# Patient Record
Sex: Female | Born: 1980
Health system: Southern US, Community
[De-identification: ages and names within clinical notes are randomized; demographics above are authoritative.]

## PROBLEM LIST (undated history)

## (undated) ENCOUNTER — Inpatient Hospital Stay (HOSPITAL_COMMUNITY): Payer: Self-pay

## (undated) DIAGNOSIS — E669 Obesity, unspecified: Secondary | ICD-10-CM

## (undated) DIAGNOSIS — A159 Respiratory tuberculosis unspecified: Secondary | ICD-10-CM

## (undated) DIAGNOSIS — Z9889 Other specified postprocedural states: Secondary | ICD-10-CM

## (undated) DIAGNOSIS — E162 Hypoglycemia, unspecified: Secondary | ICD-10-CM

## (undated) DIAGNOSIS — R87619 Unspecified abnormal cytological findings in specimens from cervix uteri: Secondary | ICD-10-CM

## (undated) DIAGNOSIS — R0601 Orthopnea: Secondary | ICD-10-CM

## (undated) DIAGNOSIS — G43909 Migraine, unspecified, not intractable, without status migrainosus: Secondary | ICD-10-CM

## (undated) DIAGNOSIS — IMO0002 Reserved for concepts with insufficient information to code with codable children: Secondary | ICD-10-CM

## (undated) DIAGNOSIS — R51 Headache: Secondary | ICD-10-CM

## (undated) DIAGNOSIS — N390 Urinary tract infection, site not specified: Secondary | ICD-10-CM

## (undated) HISTORY — DX: Orthopnea: R06.01

## (undated) HISTORY — PX: ROOT CANAL: SHX2363

## (undated) HISTORY — PX: NO PAST SURGERIES: SHX2092

## (undated) HISTORY — DX: Other specified postprocedural states: Z98.890

## (undated) HISTORY — DX: Obesity, unspecified: E66.9

## (undated) HISTORY — DX: Respiratory tuberculosis unspecified: A15.9

## (undated) HISTORY — DX: Migraine, unspecified, not intractable, without status migrainosus: G43.909

---
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
## 2001-08-05 ENCOUNTER — Emergency Department (HOSPITAL_COMMUNITY): Admission: EM | Admit: 2001-08-05 | Discharge: 2001-08-05 | Payer: Self-pay | Admitting: Emergency Medicine

## 2003-03-18 ENCOUNTER — Other Ambulatory Visit: Admission: RE | Admit: 2003-03-18 | Discharge: 2003-03-18 | Payer: Self-pay | Admitting: Obstetrics and Gynecology

## 2004-10-07 ENCOUNTER — Emergency Department (HOSPITAL_COMMUNITY): Admission: EM | Admit: 2004-10-07 | Discharge: 2004-10-07 | Payer: Self-pay | Admitting: Emergency Medicine

## 2006-10-19 ENCOUNTER — Emergency Department (HOSPITAL_COMMUNITY): Admission: EM | Admit: 2006-10-19 | Discharge: 2006-10-19 | Payer: Self-pay | Admitting: Emergency Medicine

## 2006-12-27 ENCOUNTER — Encounter: Admission: RE | Admit: 2006-12-27 | Discharge: 2006-12-27 | Payer: Self-pay | Admitting: Internal Medicine

## 2011-03-01 LAB — RUBELLA ANTIBODY, IGM: Rubella: IMMUNE

## 2011-03-01 LAB — HIV ANTIBODY (ROUTINE TESTING W REFLEX): HIV: NONREACTIVE

## 2011-03-01 LAB — ABO/RH: RH Type: POSITIVE

## 2011-03-01 LAB — HEPATITIS B SURFACE ANTIGEN: Hepatitis B Surface Ag: NEGATIVE

## 2011-03-07 ENCOUNTER — Encounter (HOSPITAL_COMMUNITY): Payer: Self-pay

## 2011-03-07 ENCOUNTER — Inpatient Hospital Stay (HOSPITAL_COMMUNITY)
Admission: AD | Admit: 2011-03-07 | Discharge: 2011-03-07 | Disposition: A | Discharge status: Home / Self Care | Payer: PRIVATE HEALTH INSURANCE | Source: Ambulatory Visit | Attending: Obstetrics and Gynecology | Admitting: Obstetrics and Gynecology

## 2011-03-07 ENCOUNTER — Inpatient Hospital Stay (HOSPITAL_COMMUNITY): Payer: PRIVATE HEALTH INSURANCE

## 2011-03-07 DIAGNOSIS — O209 Hemorrhage in early pregnancy, unspecified: Secondary | ICD-10-CM

## 2011-03-07 DIAGNOSIS — O200 Threatened abortion: Secondary | ICD-10-CM | POA: Insufficient documentation

## 2011-03-07 HISTORY — DX: Reserved for concepts with insufficient information to code with codable children: IMO0002

## 2011-03-07 HISTORY — DX: Hypoglycemia, unspecified: E16.2

## 2011-03-07 LAB — CBC
Hemoglobin: 13 g/dL (ref 12.0–15.0)
MCHC: 32.3 g/dL (ref 30.0–36.0)
WBC: 12.2 10*3/uL — ABNORMAL HIGH (ref 4.0–10.5)

## 2011-03-07 NOTE — Progress Notes (Signed)
Pt states she started having bleeding 9-2, again during the night and this am started cramping. Last sex was oral yesterday. Has had 2 previous episodes of bleeding after intercourse. Has a documented IUP in the office.

## 2011-03-07 NOTE — ED Provider Notes (Signed)
Subjective: Pt presents with complaint of spotting and cramping for one day.  She passed a clot and then started spotting.  She has not had any pain meds.  NO SPB OR CP  Objective: I have reviewed patient's vital signs, intake and output, medications, labs, microbiology, pathology and radiology results.  General: alert Resp: clear to auscultation bilaterally Cardio: regular rate and rhythm, S1, S2 normal, no murmur, click, rub or gallop GI: soft, non-tender; bowel sounds normal; no masses,  no organomegaly Extremities: extremities normal, atraumatic, no cyanosis or edema Vaginal Bleeding: no blood in vault.  cx LCP Rh positive [place] sig for live IUP at 7 weeks with [facility]   Assessment/Plan: threatned abortion Dc home with bleeding precautions  LOS: 0 days    Laura Calderon A 03/07/2011, 2:25 PM

## 2011-08-02 ENCOUNTER — Inpatient Hospital Stay (HOSPITAL_COMMUNITY)
Admission: AD | Admit: 2011-08-02 | Discharge: 2011-08-02 | Disposition: A | Discharge status: Home / Self Care | Payer: PRIVATE HEALTH INSURANCE | Source: Ambulatory Visit | Attending: Obstetrics and Gynecology | Admitting: Obstetrics and Gynecology

## 2011-08-02 ENCOUNTER — Encounter (HOSPITAL_COMMUNITY): Payer: Self-pay | Admitting: *Deleted

## 2011-08-02 DIAGNOSIS — O99891 Other specified diseases and conditions complicating pregnancy: Secondary | ICD-10-CM | POA: Insufficient documentation

## 2011-08-02 DIAGNOSIS — R109 Unspecified abdominal pain: Secondary | ICD-10-CM | POA: Insufficient documentation

## 2011-08-02 HISTORY — DX: Headache: R51

## 2011-08-02 HISTORY — DX: Unspecified abnormal cytological findings in specimens from cervix uteri: R87.619

## 2011-08-02 HISTORY — DX: Reserved for concepts with insufficient information to code with codable children: IMO0002

## 2011-08-02 HISTORY — DX: Urinary tract infection, site not specified: N39.0

## 2011-08-02 LAB — URINALYSIS, ROUTINE W REFLEX MICROSCOPIC
Glucose, UA: NEGATIVE mg/dL
Leukocytes, UA: NEGATIVE
Protein, ur: NEGATIVE mg/dL
Specific Gravity, Urine: 1.02 (ref 1.005–1.030)
Urobilinogen, UA: 0.2 mg/dL (ref 0.0–1.0)

## 2011-08-02 LAB — FETAL FIBRONECTIN: Fetal Fibronectin: NEGATIVE

## 2011-08-02 NOTE — ED Notes (Signed)
Dr. Su Hilt notified of neg FFN, no uc's on the monitors. Reactive tracing. Patient c/o of feeling sharp pain on the left side x2 no contractions palpated during this time. Orders to recheck cervix and d/c home if still closed. Also to instruct the patient to take Ibuprofen 600mg  every 6 hrs x 24 hrs then stop. To follow up in the office if pain continues.

## 2011-08-02 NOTE — Progress Notes (Signed)
Pt states has degenerative disc disease, h/o chronic back pain, notes left sided back pain, intermittent shooting pain that began after lunch. Then lower abd cramping began, rates both pains 4/10. Denies bleeding or vaginal d/c changes. +Fm.

## 2011-08-02 NOTE — ED Provider Notes (Addendum)
History   c/o cramping and back discomfort  Chief Complaint  Patient presents with  . Abdominal Cramping  . Back Pain   HPI 31 yo P0 at 28wks presenting with cramping today.  No LOF, good FM, no VB.  Reports drinking lots of water.  OB History    Grav Para Term Preterm Abortions TAB SAB Ect Mult Living   1 0 0 0 0 0 0 0 0 0       Past Medical History  Diagnosis Date  . Hypoglycemia   . Degenerative disc disease   . Headache     hx of, none recently  . Urinary tract infection   . Abnormal Pap smear     colposcopy- normal    Past Surgical History  Procedure Date  . No past surgeries     Family History  Problem Relation Age of Onset  . Anesthesia problems Neg Hx     History  Substance Use Topics  . Smoking status: Former Smoker -- 2 years  . Smokeless tobacco: Never Used   Comment: quit 2005  . Alcohol Use: Yes     no since pregnancy    Allergies: No Known Allergies  Prescriptions prior to admission  Medication Sig Dispense Refill  . Clobetasol & Clobetasol Emul 0.05 & 0.05 % MISC Apply 1 application topically 2 (two) times daily.        . DiphenhydrAMINE HCl, Sleep, (ZZZQUIL PO) Take 30 mLs by mouth daily as needed. At bedtime for insomnia.       . prenatal vitamin w/FE, FA (PRENATAL 1 + 1) 27-1 MG TABS Take 1 tablet by mouth daily.          ROS Denies abnormal discharge or lof.  No other complaints and denies urinary symptoms.  Physical Exam   Blood pressure 126/75, pulse 95, temperature 98.6 F (37 C), temperature source Oral, resp. rate 16, height 5\' 10"  (1.778 m), weight 123.492 kg (272 lb 4 oz).  Physical Exam Lungs CTA bil CV RRR Abd soft, NT Ext no calf tenderness Spec no abnl discharge VE closed, soft  FHT 140s, no decels Toco no contractions MAU Course  Procedures UA and FFN  Assessment and Plan  31 yo P0 at 28wks presenting with cramping today.  No LOF, good FM, no VB.  If FFN +, will give course of BMZ.  If neg and pt is stable  will d/c with precautions.  Fetal status overall reassuring.  Purcell Nails 08/02/2011, 6:45 PM

## 2011-08-02 NOTE — Progress Notes (Signed)
Back pain and cramping started around lunchtime.  Denies any bleeding.

## 2011-09-07 ENCOUNTER — Encounter (INDEPENDENT_AMBULATORY_CARE_PROVIDER_SITE_OTHER): Payer: PRIVATE HEALTH INSURANCE

## 2011-09-07 DIAGNOSIS — Z331 Pregnant state, incidental: Secondary | ICD-10-CM

## 2011-09-20 ENCOUNTER — Encounter (INDEPENDENT_AMBULATORY_CARE_PROVIDER_SITE_OTHER): Payer: PRIVATE HEALTH INSURANCE | Admitting: Obstetrics and Gynecology

## 2011-09-20 DIAGNOSIS — Z331 Pregnant state, incidental: Secondary | ICD-10-CM

## 2011-09-21 ENCOUNTER — Encounter: Payer: PRIVATE HEALTH INSURANCE | Admitting: Obstetrics and Gynecology

## 2011-09-26 ENCOUNTER — Encounter (INDEPENDENT_AMBULATORY_CARE_PROVIDER_SITE_OTHER): Payer: PRIVATE HEALTH INSURANCE | Admitting: Obstetrics and Gynecology

## 2011-09-26 ENCOUNTER — Other Ambulatory Visit (INDEPENDENT_AMBULATORY_CARE_PROVIDER_SITE_OTHER): Payer: PRIVATE HEALTH INSURANCE

## 2011-09-26 DIAGNOSIS — O26849 Uterine size-date discrepancy, unspecified trimester: Secondary | ICD-10-CM

## 2011-09-26 DIAGNOSIS — E669 Obesity, unspecified: Secondary | ICD-10-CM

## 2011-09-26 DIAGNOSIS — O99330 Smoking (tobacco) complicating pregnancy, unspecified trimester: Secondary | ICD-10-CM

## 2011-09-26 DIAGNOSIS — Z3480 Encounter for supervision of other normal pregnancy, unspecified trimester: Secondary | ICD-10-CM

## 2011-10-07 ENCOUNTER — Encounter (INDEPENDENT_AMBULATORY_CARE_PROVIDER_SITE_OTHER): Payer: PRIVATE HEALTH INSURANCE | Admitting: Obstetrics and Gynecology

## 2011-10-07 ENCOUNTER — Other Ambulatory Visit: Payer: PRIVATE HEALTH INSURANCE

## 2011-10-07 DIAGNOSIS — E669 Obesity, unspecified: Secondary | ICD-10-CM

## 2011-10-07 DIAGNOSIS — O321XX0 Maternal care for breech presentation, not applicable or unspecified: Secondary | ICD-10-CM

## 2011-10-08 ENCOUNTER — Encounter (HOSPITAL_COMMUNITY): Payer: Self-pay

## 2011-10-08 ENCOUNTER — Encounter (HOSPITAL_COMMUNITY)
Admission: AD | Disposition: A | Discharge status: Home / Self Care | Payer: Self-pay | Source: Ambulatory Visit | Attending: Obstetrics and Gynecology

## 2011-10-08 ENCOUNTER — Inpatient Hospital Stay (HOSPITAL_COMMUNITY): Payer: PRIVATE HEALTH INSURANCE

## 2011-10-08 ENCOUNTER — Inpatient Hospital Stay (HOSPITAL_COMMUNITY)
Admission: AD | Admit: 2011-10-08 | Discharge: 2011-10-11 | DRG: 766 | Disposition: A | Discharge status: Home / Self Care | Payer: PRIVATE HEALTH INSURANCE | Source: Ambulatory Visit | Attending: Obstetrics and Gynecology | Admitting: Obstetrics and Gynecology

## 2011-10-08 DIAGNOSIS — O4290 Premature rupture of membranes, unspecified as to length of time between rupture and onset of labor, unspecified weeks of gestation: Secondary | ICD-10-CM

## 2011-10-08 DIAGNOSIS — O99210 Obesity complicating pregnancy, unspecified trimester: Secondary | ICD-10-CM | POA: Diagnosis present

## 2011-10-08 DIAGNOSIS — Z3400 Encounter for supervision of normal first pregnancy, unspecified trimester: Secondary | ICD-10-CM

## 2011-10-08 DIAGNOSIS — IMO0001 Reserved for inherently not codable concepts without codable children: Secondary | ICD-10-CM

## 2011-10-08 DIAGNOSIS — O99891 Other specified diseases and conditions complicating pregnancy: Secondary | ICD-10-CM | POA: Diagnosis present

## 2011-10-08 DIAGNOSIS — O321XX0 Maternal care for breech presentation, not applicable or unspecified: Principal | ICD-10-CM | POA: Diagnosis present

## 2011-10-08 DIAGNOSIS — D649 Anemia, unspecified: Secondary | ICD-10-CM | POA: Diagnosis not present

## 2011-10-08 DIAGNOSIS — M549 Dorsalgia, unspecified: Secondary | ICD-10-CM | POA: Diagnosis present

## 2011-10-08 LAB — CBC
Hemoglobin: 12.3 g/dL (ref 12.0–15.0)
MCHC: 33.1 g/dL (ref 30.0–36.0)
RDW: 13.9 % (ref 11.5–15.5)

## 2011-10-08 LAB — RPR: RPR Ser Ql: NONREACTIVE

## 2011-10-08 SURGERY — Surgical Case
Anesthesia: Spinal | Site: Abdomen | Wound class: Clean Contaminated

## 2011-10-08 MED ORDER — CLOBETASOL PROPIONATE 0.05 % EX OINT
1.0000 "application " | TOPICAL_OINTMENT | CUTANEOUS | Status: DC
  Administered 2011-10-10: 1 via TOPICAL

## 2011-10-08 MED ORDER — EPHEDRINE SULFATE 50 MG/ML IJ SOLN
INTRAMUSCULAR | Status: DC | PRN
  Administered 2011-10-08 (×4): 10 mg via INTRAVENOUS

## 2011-10-08 MED ORDER — SIMETHICONE 80 MG PO CHEW
80.0000 mg | CHEWABLE_TABLET | Freq: Three times a day (TID) | ORAL | Status: DC
  Administered 2011-10-08 – 2011-10-11 (×10): 80 mg via ORAL

## 2011-10-08 MED ORDER — IBUPROFEN 600 MG PO TABS
600.0000 mg | ORAL_TABLET | Freq: Four times a day (QID) | ORAL | Status: DC
  Administered 2011-10-09 – 2011-10-11 (×9): 600 mg via ORAL
  Filled 2011-10-08 (×8): qty 1

## 2011-10-08 MED ORDER — HYDROMORPHONE HCL PF 1 MG/ML IJ SOLN
INTRAMUSCULAR | Status: AC
  Administered 2011-10-08: 0.5 mg via INTRAVENOUS
  Filled 2011-10-08: qty 1

## 2011-10-08 MED ORDER — ZOLPIDEM TARTRATE 5 MG PO TABS: 5.0000 mg | ORAL_TABLET | Freq: Every evening | ORAL | Status: DC | PRN

## 2011-10-08 MED ORDER — ONDANSETRON HCL 4 MG PO TABS: 4.0000 mg | ORAL_TABLET | ORAL | Status: DC | PRN

## 2011-10-08 MED ORDER — DIBUCAINE 1 % RE OINT: 1.0000 "application " | TOPICAL_OINTMENT | RECTAL | Status: DC | PRN

## 2011-10-08 MED ORDER — MORPHINE SULFATE (PF) 0.5 MG/ML IJ SOLN
INTRAMUSCULAR | Status: DC | PRN
  Administered 2011-10-08: .1 mg via INTRATHECAL

## 2011-10-08 MED ORDER — SODIUM CHLORIDE 0.9 % IJ SOLN: 3.0000 mL | INTRAMUSCULAR | Status: DC | PRN

## 2011-10-08 MED ORDER — CEFAZOLIN SODIUM-DEXTROSE 2-3 GM-% IV SOLR
2.0000 g | Freq: Once | INTRAVENOUS | Status: AC
  Administered 2011-10-08: 2 g via INTRAVENOUS
  Filled 2011-10-08: qty 50

## 2011-10-08 MED ORDER — KETOROLAC TROMETHAMINE 30 MG/ML IJ SOLN
30.0000 mg | Freq: Four times a day (QID) | INTRAMUSCULAR | Status: AC | PRN
  Administered 2011-10-08 – 2011-10-09 (×2): 30 mg via INTRAVENOUS
  Filled 2011-10-08 (×3): qty 1

## 2011-10-08 MED ORDER — MEPERIDINE HCL 25 MG/ML IJ SOLN: 6.2500 mg | INTRAMUSCULAR | Status: DC | PRN

## 2011-10-08 MED ORDER — OXYCODONE-ACETAMINOPHEN 5-325 MG PO TABS
1.0000 | ORAL_TABLET | ORAL | Status: DC | PRN
  Administered 2011-10-08: 1 via ORAL
  Administered 2011-10-08 – 2011-10-09 (×3): 2 via ORAL
  Administered 2011-10-09 – 2011-10-11 (×6): 1 via ORAL
  Filled 2011-10-08 (×3): qty 1
  Filled 2011-10-08: qty 2
  Filled 2011-10-08 (×2): qty 1
  Filled 2011-10-08 (×2): qty 2
  Filled 2011-10-08: qty 1
  Filled 2011-10-08: qty 2

## 2011-10-08 MED ORDER — WITCH HAZEL-GLYCERIN EX PADS: 1.0000 "application " | MEDICATED_PAD | CUTANEOUS | Status: DC | PRN

## 2011-10-08 MED ORDER — NALBUPHINE HCL 10 MG/ML IJ SOLN
5.0000 mg | INTRAMUSCULAR | Status: DC | PRN
  Filled 2011-10-08: qty 1

## 2011-10-08 MED ORDER — DIPHENHYDRAMINE HCL 25 MG PO CAPS: 25.0000 mg | ORAL_CAPSULE | ORAL | Status: DC | PRN

## 2011-10-08 MED ORDER — VALACYCLOVIR HCL 500 MG PO TABS
500.0000 mg | ORAL_TABLET | Freq: Two times a day (BID) | ORAL | Status: DC
  Administered 2011-10-08 – 2011-10-10 (×3): 500 mg via ORAL
  Filled 2011-10-08 (×7): qty 1

## 2011-10-08 MED ORDER — FENTANYL CITRATE 0.05 MG/ML IJ SOLN
INTRAMUSCULAR | Status: DC | PRN
  Administered 2011-10-08: 12.5 ug via INTRATHECAL

## 2011-10-08 MED ORDER — OXYTOCIN 20 UNITS IN LACTATED RINGERS INFUSION - SIMPLE: 125.0000 mL/h | INTRAVENOUS | Status: AC

## 2011-10-08 MED ORDER — LACTATED RINGERS IV SOLN
INTRAVENOUS | Status: DC
  Administered 2011-10-08 (×4): via INTRAVENOUS

## 2011-10-08 MED ORDER — SCOPOLAMINE 1 MG/3DAYS TD PT72
1.0000 | MEDICATED_PATCH | Freq: Once | TRANSDERMAL | Status: AC
  Administered 2011-10-08: 1.5 mg via TRANSDERMAL

## 2011-10-08 MED ORDER — BUPIVACAINE HCL (PF) 0.25 % IJ SOLN
INTRAMUSCULAR | Status: DC | PRN
  Administered 2011-10-08: 10 mL

## 2011-10-08 MED ORDER — BUPIVACAINE IN DEXTROSE 0.75-8.25 % IT SOLN
INTRATHECAL | Status: DC | PRN
  Administered 2011-10-08: 1.8 mL via INTRATHECAL

## 2011-10-08 MED ORDER — LACTATED RINGERS IV SOLN
INTRAVENOUS | Status: DC
  Administered 2011-10-08: 125 mL/h via INTRAVENOUS
  Administered 2011-10-08: 17:00:00 via INTRAVENOUS

## 2011-10-08 MED ORDER — DIPHENHYDRAMINE HCL 50 MG/ML IJ SOLN: 12.5000 mg | INTRAMUSCULAR | Status: DC | PRN

## 2011-10-08 MED ORDER — KETOROLAC TROMETHAMINE 30 MG/ML IJ SOLN: 30.0000 mg | Freq: Four times a day (QID) | INTRAMUSCULAR | Status: AC | PRN

## 2011-10-08 MED ORDER — PRENATAL MULTIVITAMIN CH
1.0000 | ORAL_TABLET | Freq: Every day | ORAL | Status: DC
  Administered 2011-10-08 – 2011-10-11 (×4): 1 via ORAL
  Filled 2011-10-08 (×4): qty 1

## 2011-10-08 MED ORDER — CITRIC ACID-SODIUM CITRATE 334-500 MG/5ML PO SOLN
30.0000 mL | Freq: Once | ORAL | Status: AC
  Administered 2011-10-08: 30 mL via ORAL
  Filled 2011-10-08: qty 15

## 2011-10-08 MED ORDER — SIMETHICONE 80 MG PO CHEW: 80.0000 mg | CHEWABLE_TABLET | ORAL | Status: DC | PRN

## 2011-10-08 MED ORDER — DIPHENHYDRAMINE HCL 25 MG PO CAPS: 25.0000 mg | ORAL_CAPSULE | Freq: Four times a day (QID) | ORAL | Status: DC | PRN

## 2011-10-08 MED ORDER — PROMETHAZINE HCL 25 MG/ML IJ SOLN: 6.2500 mg | INTRAMUSCULAR | Status: DC | PRN

## 2011-10-08 MED ORDER — LANOLIN HYDROUS EX OINT: 1.0000 "application " | TOPICAL_OINTMENT | CUTANEOUS | Status: DC | PRN

## 2011-10-08 MED ORDER — BUPIVACAINE HCL (PF) 0.25 % IJ SOLN
INTRAMUSCULAR | Status: AC
  Filled 2011-10-08: qty 30

## 2011-10-08 MED ORDER — CLOBETASOL & CLOBETASOL EMUL 0.05 & 0.05 % EX MISC: 1.0000 "application " | CUTANEOUS | Status: DC

## 2011-10-08 MED ORDER — 0.9 % SODIUM CHLORIDE (POUR BTL) OPTIME
TOPICAL | Status: DC | PRN
  Administered 2011-10-08: 1000 mL

## 2011-10-08 MED ORDER — OXYTOCIN 20 UNITS IN LACTATED RINGERS INFUSION - SIMPLE
INTRAVENOUS | Status: DC | PRN
  Administered 2011-10-08 (×2): 20 [IU] via INTRAVENOUS

## 2011-10-08 MED ORDER — ONDANSETRON HCL 4 MG/2ML IJ SOLN: 4.0000 mg | Freq: Three times a day (TID) | INTRAMUSCULAR | Status: DC | PRN

## 2011-10-08 MED ORDER — DIPHENHYDRAMINE HCL 50 MG/ML IJ SOLN: 25.0000 mg | INTRAMUSCULAR | Status: DC | PRN

## 2011-10-08 MED ORDER — KETOROLAC TROMETHAMINE 60 MG/2ML IM SOLN
60.0000 mg | Freq: Once | INTRAMUSCULAR | Status: AC | PRN
  Administered 2011-10-08: 60 mg via INTRAMUSCULAR

## 2011-10-08 MED ORDER — KETOROLAC TROMETHAMINE 60 MG/2ML IM SOLN
INTRAMUSCULAR | Status: AC
  Administered 2011-10-08: 60 mg via INTRAMUSCULAR
  Filled 2011-10-08: qty 2

## 2011-10-08 MED ORDER — SENNOSIDES-DOCUSATE SODIUM 8.6-50 MG PO TABS
2.0000 | ORAL_TABLET | Freq: Every day | ORAL | Status: DC
  Administered 2011-10-08 – 2011-10-10 (×3): 2 via ORAL

## 2011-10-08 MED ORDER — HYDROMORPHONE HCL PF 1 MG/ML IJ SOLN
0.2500 mg | INTRAMUSCULAR | Status: DC | PRN
  Administered 2011-10-08 (×4): 0.5 mg via INTRAVENOUS

## 2011-10-08 MED ORDER — MEDROXYPROGESTERONE ACETATE 150 MG/ML IM SUSP: 150.0000 mg | INTRAMUSCULAR | Status: DC | PRN

## 2011-10-08 MED ORDER — ONDANSETRON HCL 4 MG/2ML IJ SOLN
INTRAMUSCULAR | Status: DC | PRN
  Administered 2011-10-08: 4 mg via INTRAVENOUS

## 2011-10-08 MED ORDER — ONDANSETRON HCL 4 MG/2ML IJ SOLN: 4.0000 mg | INTRAMUSCULAR | Status: DC | PRN

## 2011-10-08 MED ORDER — NALOXONE HCL 0.4 MG/ML IJ SOLN: 1.0000 ug/kg/h | INTRAMUSCULAR | Status: DC | PRN

## 2011-10-08 MED ORDER — PHENYLEPHRINE HCL 10 MG/ML IJ SOLN
INTRAMUSCULAR | Status: DC | PRN
  Administered 2011-10-08: 120 ug via INTRAVENOUS
  Administered 2011-10-08: 40 ug via INTRAVENOUS

## 2011-10-08 MED ORDER — TETANUS-DIPHTH-ACELL PERTUSSIS 5-2.5-18.5 LF-MCG/0.5 IM SUSP
0.5000 mL | Freq: Once | INTRAMUSCULAR | Status: AC
  Administered 2011-10-09: 0.5 mL via INTRAMUSCULAR
  Filled 2011-10-08: qty 0.5

## 2011-10-08 MED ORDER — LACTATED RINGERS IV BOLUS (SEPSIS)
1000.0000 mL | Freq: Once | INTRAVENOUS | Status: AC
  Administered 2011-10-08: 1000 mL via INTRAVENOUS

## 2011-10-08 MED ORDER — FAMOTIDINE IN NACL 20-0.9 MG/50ML-% IV SOLN
20.0000 mg | Freq: Once | INTRAVENOUS | Status: AC
  Administered 2011-10-08: 20 mg via INTRAVENOUS
  Filled 2011-10-08: qty 50

## 2011-10-08 MED ORDER — NALOXONE HCL 0.4 MG/ML IJ SOLN: 0.4000 mg | INTRAMUSCULAR | Status: DC | PRN

## 2011-10-08 MED ORDER — KETOROLAC TROMETHAMINE 30 MG/ML IJ SOLN: 15.0000 mg | Freq: Once | INTRAMUSCULAR | Status: DC | PRN

## 2011-10-08 MED ORDER — SCOPOLAMINE 1 MG/3DAYS TD PT72
MEDICATED_PATCH | TRANSDERMAL | Status: AC
  Administered 2011-10-08: 1.5 mg via TRANSDERMAL
  Filled 2011-10-08: qty 1

## 2011-10-08 MED ORDER — MENTHOL 3 MG MT LOZG: 1.0000 | LOZENGE | OROMUCOSAL | Status: DC | PRN

## 2011-10-08 SURGICAL SUPPLY — 55 items
ADH SKN CLS APL DERMABOND .7 (GAUZE/BANDAGES/DRESSINGS)
APL SKNCLS STERI-STRIP NONHPOA (GAUZE/BANDAGES/DRESSINGS)
BENZOIN TINCTURE PRP APPL 2/3 (GAUZE/BANDAGES/DRESSINGS) IMPLANT
BLADE EXTENDED COATED 6.5IN (ELECTRODE) IMPLANT
BLADE HEX COATED 2.75 (ELECTRODE) IMPLANT
BOOTIES KNEE HIGH SLOAN (MISCELLANEOUS) ×4 IMPLANT
CHLORAPREP W/TINT 26ML (MISCELLANEOUS) ×2 IMPLANT
CLOTH BEACON ORANGE TIMEOUT ST (SAFETY) ×2 IMPLANT
CONTAINER PREFILL 10% NBF 15ML (MISCELLANEOUS) ×2 IMPLANT
DERMABOND ADVANCED (GAUZE/BANDAGES/DRESSINGS)
DERMABOND ADVANCED .7 DNX12 (GAUZE/BANDAGES/DRESSINGS) IMPLANT
DRAIN JACKSON PRT FLT 7MM (DRAIN) IMPLANT
DRESSING TELFA 8X3 (GAUZE/BANDAGES/DRESSINGS) IMPLANT
DRSG COVADERM 4X10 (GAUZE/BANDAGES/DRESSINGS) ×1 IMPLANT
ELECT REM PT RETURN 9FT ADLT (ELECTROSURGICAL) ×2
ELECTRODE REM PT RTRN 9FT ADLT (ELECTROSURGICAL) ×1 IMPLANT
EVACUATOR SILICONE 100CC (DRAIN) IMPLANT
EXTRACTOR VACUUM KIWI (MISCELLANEOUS) IMPLANT
EXTRACTOR VACUUM M CUP 4 TUBE (SUCTIONS) IMPLANT
GAUZE SPONGE 4X4 12PLY STRL LF (GAUZE/BANDAGES/DRESSINGS) ×2 IMPLANT
GLOVE BIO SURGEON STRL SZ 6.5 (GLOVE) ×1 IMPLANT
GLOVE SKINSENSE NS SZ7.5 (GLOVE) ×1
GLOVE SKINSENSE NS SZ8.0 LF (GLOVE) ×1
GLOVE SKINSENSE STRL SZ7.5 (GLOVE) IMPLANT
GLOVE SKINSENSE STRL SZ8.0 LF (GLOVE) IMPLANT
GLOVE SURG SS PI 6.5 STRL IVOR (GLOVE) ×4 IMPLANT
GOWN PREVENTION PLUS LG XLONG (DISPOSABLE) ×6 IMPLANT
KIT ABG SYR 3ML LUER SLIP (SYRINGE) IMPLANT
NDL HYPO 25X5/8 SAFETYGLIDE (NEEDLE) ×1 IMPLANT
NDL SPNL 22GX3.5 QUINCKE BK (NEEDLE) ×1 IMPLANT
NEEDLE HYPO 25X5/8 SAFETYGLIDE (NEEDLE) IMPLANT
NEEDLE SPNL 22GX3.5 QUINCKE BK (NEEDLE) ×2 IMPLANT
NS IRRIG 1000ML POUR BTL (IV SOLUTION) ×2 IMPLANT
PACK C SECTION WH (CUSTOM PROCEDURE TRAY) ×2 IMPLANT
PAD ABD 7.5X8 STRL (GAUZE/BANDAGES/DRESSINGS) IMPLANT
RETRACTOR WND ALEXIS 25 LRG (MISCELLANEOUS) IMPLANT
RTRCTR WOUND ALEXIS 25CM LRG (MISCELLANEOUS) ×2
SLEEVE SCD COMPRESS KNEE MED (MISCELLANEOUS) ×1 IMPLANT
STRIP CLOSURE SKIN 1/4X4 (GAUZE/BANDAGES/DRESSINGS) IMPLANT
SUT CHROMIC 2 0 SH (SUTURE) ×1 IMPLANT
SUT MNCRL AB 3-0 PS2 27 (SUTURE) ×2 IMPLANT
SUT SILK 0 FSL (SUTURE) IMPLANT
SUT VIC AB 0 CT1 27 (SUTURE) ×4
SUT VIC AB 0 CT1 27XBRD ANBCTR (SUTURE) ×2 IMPLANT
SUT VIC AB 0 CT1 36 (SUTURE) ×2 IMPLANT
SUT VIC AB 0 CTXB 36 (SUTURE) IMPLANT
SUT VIC AB 2-0 CT1 27 (SUTURE) ×2
SUT VIC AB 2-0 CT1 TAPERPNT 27 (SUTURE) ×2 IMPLANT
SUT VIC AB 2-0 SH 27 (SUTURE)
SUT VIC AB 2-0 SH 27XBRD (SUTURE) IMPLANT
SYR BULB 3OZ (MISCELLANEOUS) ×1 IMPLANT
SYR CONTROL 10ML LL (SYRINGE) ×2 IMPLANT
TOWEL OR 17X24 6PK STRL BLUE (TOWEL DISPOSABLE) ×4 IMPLANT
TRAY FOLEY CATH 14FR (SET/KITS/TRAYS/PACK) ×2 IMPLANT
WATER STERILE IRR 1000ML POUR (IV SOLUTION) ×1 IMPLANT

## 2011-10-08 NOTE — Anesthesia Preprocedure Evaluation (Signed)
Anesthesia Evaluation  Patient identified by MRN, date of birth, ID band Patient awake    Reviewed: Allergy & Precautions, H&P , NPO status , Patient's Chart, lab work & pertinent test results  Airway Mallampati: I TM Distance: >3 FB Neck ROM: full    Dental No notable dental hx.    Pulmonary neg pulmonary ROS,    Pulmonary exam normal       Cardiovascular negative cardio ROS      Neuro/Psych negative psych ROS   GI/Hepatic negative GI ROS, Neg liver ROS,   Endo/Other  negative endocrine ROSMorbid obesity  Renal/GU negative Renal ROS  negative genitourinary   Musculoskeletal   Abdominal (+) + obese,   Peds negative pediatric ROS (+)  Hematology negative hematology ROS (+)   Anesthesia Other Findings   Reproductive/Obstetrics (+) Pregnancy                           Anesthesia Physical Anesthesia Plan  ASA: III and Emergent  Anesthesia Plan: Spinal   Post-op Pain Management:    Induction:   Airway Management Planned:   Additional Equipment:   Intra-op Plan:   Post-operative Plan:   Informed Consent: I have reviewed the patients History and Physical, chart, labs and discussed the procedure including the risks, benefits and alternatives for the proposed anesthesia with the patient or authorized representative who has indicated his/her understanding and acceptance.     Plan Discussed with: CRNA and Surgeon  Anesthesia Plan Comments:         Anesthesia Quick Evaluation

## 2011-10-08 NOTE — Transfer of Care (Signed)
Immediate Anesthesia Transfer of Care Note  Patient: Laura Calderon  Procedure(s) Performed: Procedure(s) (LRB): CESAREAN SECTION (N/A)  Patient Location: PACU  Anesthesia Type: Spinal  Level of Consciousness: awake, alert  and oriented  Airway & Oxygen Therapy: Patient Spontanous Breathing  Post-op Assessment: Report given to PACU RN and Post -op Vital signs reviewed and stable  Post vital signs: Reviewed and stable  Complications: No apparent anesthesia complications

## 2011-10-08 NOTE — Anesthesia Postprocedure Evaluation (Signed)
  Anesthesia Post-op Note  Patient: Laura Calderon  Procedure(s) Performed: Procedure(s) (LRB): CESAREAN SECTION (N/A)  Patient Location: PACU  Anesthesia Type: Spinal  Level of Consciousness: awake, alert  and oriented  Airway and Oxygen Therapy: Patient Spontanous Breathing  Post-op Pain: mild  Post-op Assessment: Patient's Cardiovascular Status Stable, Respiratory Function Stable, Patent Airway, No signs of Nausea or vomiting and Pain level controlled  Post-op Vital Signs: Reviewed and stable  Complications: No apparent anesthesia complications

## 2011-10-08 NOTE — Anesthesia Postprocedure Evaluation (Signed)
  Anesthesia Post-op Note  Patient: Laura Calderon  Procedure(s) Performed: Procedure(s) (LRB): CESAREAN SECTION (N/A)  Patient Location: PACU and Mother/Baby  Anesthesia Type: Spinal  Level of Consciousness: awake, alert  and oriented  Airway and Oxygen Therapy: Patient Spontanous Breathing   Post-op Assessment: Patient's Cardiovascular Status Stable and Respiratory Function Stable  Post-op Vital Signs: stable  Complications: No apparent anesthesia complications

## 2011-10-08 NOTE — Op Note (Signed)
Cesarean Section Procedure Note  Indications: malpresentation: Breech  Pre-operative Diagnosis: 37 week 4 day pregnancy. SROM  Post-operative Diagnosis: same. Laura Calderon breech  Surgeon: Hal Morales  First Assistant:  Surgeon: Hal Morales   Assistants: Denny Levy, CNM  Anesthesia: Spinal anesthesia  ASA Class: 3  Procedure Details  The patient was seen in the Holding Room. The risks, benefits, complications, treatment options, and expected outcomes were discussed with the patient.  The patient concurred with the proposed plan, giving informed consent.  The site of surgery properly noted/marked. The patient was taken to Operating Room # 1, identified as Laura Calderon and the procedure verified as C-Section Delivery. A Time Out was held and the above information confirmed.  After induction of anesthesia, the patient was  prepped withChloraPrep in the usual sterile manner.A foley catheter was placed under sterile conditions.  The patient was then draped in the usual fashion.   Suprapubicsubcutaneous injection of 0.25% Bupivacaine   A Pfannenstiel incision was made and carried down through the subcutaneous tissue to the fascia. Fascial incision was made and extended transversely. The fascia was separated from the underlying rectus tissue superiorly and inferiorly. The peritoneum was identified and entered. Peritoneal incision was extended longitudinally.  The bladder blade was placed.   A low transverse uterine incision was made two cm above the uterovesical fold, and that incision extended transversely bluntly.The infant was  delivered from flank breech presentation.with Apgar scores of 9 at one minute and 9 at five minutes.  The weight was pending at the time of this dictation.   After the umbilical cord was clamped and cut cord blood was obtained for evaluation. The placenta was removed intact and appeared normal. The uterine outline, tubes and ovaries appeared norma for the  gravid state. The uterine incision was closed with running locked sutures of 0 Vicryl. An imbricating layer of sutures was placed. Hemostasis was observed. Lavage was carried out until clear. The peritoneum was closed with a running suture of 2-0 Vicryl.  The rectus muscles were reapproximated with a figure of 8 suture of 2-0 Vicryl.  The fascia was then reapproximated with a running sutures of 0 Vicryl .Renforcing figure of 8 sutures of 0Vicryl were placed on either side of midline.   The skin was reapproximated with }3-0 moncryl.  A sterile dressing was applied.the parents and infant were then taken to the recovery room in satisfactory condition having tolerated the procedure well with sponge and instrument counts correct  Instrument, sponge, and needle counts were correct prior to the abdominal closure and at the conclusion of the case. The infant remained in the operating room for her skin to skin bond with the parents.  Findings:  Placenta contained a 3vessel cord    Estimated Blood Loss:  5000         Drains: none         Total IV Fluids:         Specimens: placenta to birthing suites         Implants: none         Complications: ::"None; patient tolerated the procedure well."         Disposition: PACU - hemodynamically stable.         Condition: stable  Attending Attestation: I performed the procedure.  Sasuke Yaffe P  10/08/2011 7:36 AM

## 2011-10-08 NOTE — Addendum Note (Signed)
Addendum  created 10/08/11 1707 by Len Blalock, CRNA   Modules edited:Notes Section

## 2011-10-08 NOTE — Consult Note (Signed)
Neonatology Note:   Attendance at C-section:    I was asked to attend this primary C/S at 37 4/7 weeks due to breech presentation and SROM this morning. The mother is a G1P0 O pos, GBS pending from 4/5. ROM 2 hours prior to delivery, fluid clear. Infant delivered frank breech,  vigorous with good spontaneous cry and tone. Needed only minimal bulb suctioning. Ap 9/9. Lungs clear to ausc in DR. Small cafe au lait spot on left knee. To CN to care of Pediatrician.   Deatra James, MD

## 2011-10-08 NOTE — Anesthesia Postprocedure Evaluation (Signed)
  Anesthesia Post-op Note  Patient: Laura Calderon  Procedure(s) Performed: Procedure(s) (LRB): CESAREAN SECTION (N/A)  Patient Location: PACU  Anesthesia Type: Spinal  Level of Consciousness: awake, alert  and oriented  Airway and Oxygen Therapy: Patient Spontanous Breathing  Post-op Pain: none  Post-op Assessment: Post-op Vital signs reviewed, Patient's Cardiovascular Status Stable, Respiratory Function Stable, Patent Airway, No signs of Nausea or vomiting, Pain level controlled, No headache and No backache  Post-op Vital Signs: Reviewed and stable  Complications: No apparent anesthesia complications

## 2011-10-08 NOTE — Anesthesia Procedure Notes (Signed)
Spinal  Patient location during procedure: OR Start time: 10/08/2011 5:59 AM End time: 10/08/2011 6:03 AM Staffing Anesthesiologist: Sandrea Hughs Performed by: anesthesiologist  Preanesthetic Checklist Completed: patient identified, site marked, surgical consent, pre-op evaluation, timeout performed, IV checked, risks and benefits discussed and monitors and equipment checked Spinal Block Patient position: sitting Prep: DuraPrep Patient monitoring: heart rate, cardiac monitor, continuous pulse ox and blood pressure Approach: midline Location: L3-4 Injection technique: single-shot Needle Needle type: Sprotte  Needle gauge: 24 G Needle length: 12.7 cm Needle insertion depth: 10 cm Assessment Sensory level: T4 Events: paresthesia Additional Notes L Leg X 1. Transient. Not on injection

## 2011-10-08 NOTE — H&P (Signed)
Laura Calderon is a 31 y.o.married black female presenting at 37.4 weeks per [facility] 10/25/11 for spontaneous rupture of membranes at 0408.  Pt denies any VB or PIH s/s.  Reports GFM.  No solid food since 2230.  "Sip" of water just before leaving house to come to hospital.  Accompanied by her husband to hospital.  Pt's pregnancy has been overall uncomplicated.  She has struggled with worsening back pain (previous issues in L4-L5 area) as the pregnancy has progressed, and has in latter 3rd trimester started working some from home and has started working only 3-4 days per week.  She started care around 5 weeks and had early dating u/s at 6 weeks.  She declined aneuploidy screening.  Late 2nd trimester was complaining of increased ctxs and pelvic pain, and FFN negative.  She has continued on her topical "clobatazole" for VIN which was Rx'd by Dr. Normand Sloop prior to pregnancy.  Fundal height measuring S>D around 28 weeks and has had serial growth u/s in 3rd trimester.   Maternal Medical History:  Reason for admission: Reason for admission: rupture of membranes.  Contractions: Onset was less than 1 hour ago.   Frequency: irregular.   Perceived severity is mild.    Fetal activity: Perceived fetal activity is normal.   Last perceived fetal movement was within the past hour.    Prenatal complications: 1.  Irregular cycles 2.  Unsure LMP 3.  Obese 4.  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  H/o + PPD 1999--negative XRYs 6.  H/o migraines 7.  Smoking hx 8.  H/o abnl pap 9.  H/o VIN 10.  1st trimester VB w/ SCH 11.  Known breech     OB History    Grav Para Term Preterm Abortions TAB SAB Ect Mult Living   1 0 0 0 0 0 0 0 0 0      Past Medical History  Diagnosis Date  . Hypoglycemia   . Degenerative disc disease   . Headache     hx of, none recently  . Urinary tract infection   . Abnormal Pap smear     colposcopy- normal   Past Surgical History  Procedure Date  . No past surgeries    Family  History: family history includes Cancer in her maternal aunt and maternal grandmother; Diabetes in her mother; Sleep apnea in her father; and Stroke in her maternal grandfather.  There is no history of Anesthesia problems. Social History:  reports that she has quit smoking. She has never used smokeless tobacco. She reports that she drinks alcohol. She reports that she does not use illicit drugs.MBF; pt is an Pensions consultant.  Husband is involved and supportive and works in Office manager.  Review of Systems  Constitutional: Negative.   HENT: Negative.   Eyes: Negative.   Respiratory: Negative.   Cardiovascular: Negative.   Gastrointestinal: Positive for diarrhea.       Loose stools about 1 week ago  Genitourinary: Negative.   Skin: Negative.   Neurological: Negative.     Dilation: 1 Effacement (%): 50 Exam by:: hilary Elsy Chiang cnm Blood pressure 141/88, pulse 130, temperature 97.9 F (36.6 C), temperature source Oral, resp. rate 20, SpO2 99.00%. Maternal Exam:  Uterine Assessment: Contraction strength is mild.  Contraction frequency is irregular.  UC's q 3-8  Abdomen: Patient reports no abdominal tenderness. Fetal presentation: breech  Introitus: Ferning test: not done.  Nitrazine test: not done. Amniotic fluid character: meconium stained.  Cervix: Cervix evaluated by digital exam.  Fetal Exam Fetal Monitor Review: Mode: ultrasound.   Baseline rate: 145.  Variability: moderate (6-25 bpm).   Pattern: accelerations present and no decelerations.    Fetal State Assessment: Category I - tracings are normal.     Physical Exam  Constitutional: She is oriented to person, place, and time. She appears well-developed and well-nourished. No distress.  HENT:  Head: Normocephalic and atraumatic.       glasses  Neck: Normal range of motion.  Cardiovascular: Normal rate and regular rhythm.   Respiratory: Effort normal and breath sounds normal.  GI: Soft. Bowel sounds are normal.        Gravid:  Bedside u/s confirmed fetal head in maternal RUQ  Genitourinary:       Cx:  1/50/-3 to -2; moderate meconium on vulva;   Musculoskeletal: She exhibits edema.       Pedal and ankle edema; nonpitting BLE  Neurological: She is alert and oriented to person, place, and time. She has normal reflexes.  Skin: Skin is warm and dry.  Psychiatric: She has a normal mood and affect. Her behavior is normal. Judgment and thought content normal.    Prenatal labs: ABO, Rh:  O positive Antibody:  negative Rubella:  immune RPR:   nonreactive HBsAg:   negative HIV:   nonreactive GBS:   pending--done 10/07/11 1hr gtt=116 Hgb at NOB=13.2 Assessment/Plan: 1.  IUP at 37.4 2.  SROM, moderate meconium in early labor 3.  Persisting breech presentation 4.  Cat I FHT 5.  GBS pending from 10/07/11 6.  Obese  1.  Admit to [facility] w/ Dr. Pennie Rushing as attending 2.  Following bedside u/s to confirm persisting malpresentation, Primary LTCS again rec'd.  R/B/A disc'd, and risks of bleeding, infection, damage to surrounding organs, and anesthesia complications rev'd w/ pt and her s.o.  Following this discussion, pt verbalized understanding and agreeable to proceed with c/s for delivery. 3.  MD to follow 4.  Routine preop orders  Havilah Topor H 10/08/2011, 5:19 AM

## 2011-10-08 NOTE — MAU Note (Signed)
Patient is here with c/o rupture of membranes at 0408am, she states that the fluids is green tinged. She states that she is breeched. Reports good fetal movement.

## 2011-10-09 DIAGNOSIS — D649 Anemia, unspecified: Secondary | ICD-10-CM | POA: Diagnosis not present

## 2011-10-09 LAB — CBC
Hemoglobin: 10 g/dL — ABNORMAL LOW (ref 12.0–15.0)
MCH: 25.8 pg — ABNORMAL LOW (ref 26.0–34.0)
MCHC: 32.1 g/dL (ref 30.0–36.0)
MCV: 80.6 fL (ref 78.0–100.0)

## 2011-10-09 NOTE — Progress Notes (Signed)
Post Partum Day 1  Subjective: no complaints, up ad lib, voiding, tolerating PO and + flatus  Objective: Blood pressure 132/81, pulse 96, temperature 98.1 F (36.7 C), temperature source Oral, resp. rate 18, height 5\' 10"  (1.778 m), weight 127.914 kg (282 lb), SpO2 96.00%, unknown if currently breastfeeding.  Physical Exam:  General: alert, cooperative and no distress Lochia: appropriate Uterine Fundus: firm Incision: healing well, no significant drainage, no dehiscence, no significant erythema DVT Evaluation: Negative Homan's sign. Calf/Ankle edema is present.   Basename 10/09/11 0540 10/08/11 0500  HGB 10.0* 12.3  HCT 31.2* 37.2    Assessment/Plan: Breastfeeding, Lactation consult and Circumcision prior to discharge   LOS: 1 day   Laura Calderon 10/09/2011, 10:56 AM

## 2011-10-10 ENCOUNTER — Encounter (HOSPITAL_COMMUNITY): Payer: Self-pay | Admitting: Obstetrics and Gynecology

## 2011-10-10 NOTE — Progress Notes (Addendum)
PO Cesarean section day 2 S: meds for pain are working, no nausea wit diet, voids, passing gas. Breastfeeding well O VSS  Results for orders placed during the hospital encounter of 10/08/11 (from the past 48 hour(s))  CBC     Status: Abnormal   Collection Time   10/09/11  5:40 AM      Component Value Range Comment   WBC 10.9 (*) 4.0 - 10.5 (K/uL)    RBC 3.87  3.87 - 5.11 (MIL/uL)    Hemoglobin 10.0 (*) 12.0 - 15.0 (g/dL)    HCT 40.9 (*) 81.1 - 46.0 (%)    MCV 80.6  78.0 - 100.0 (fL)    MCH 25.8 (*) 26.0 - 34.0 (pg)    MCHC 32.1  30.0 - 36.0 (g/dL)    RDW 91.4  78.2 - 95.6 (%)    Platelets 205  150 - 400 (K/uL)        Alert, no distress, lungs clear bilaterally, AP RRR, abd softly tympanic, bowel sounds hypoactive, abd incision well approximated no redness,edema, or drainage scant seroa flow, -Homan's sign bilaterally, +1 edema lower legs A po day 2     Normal involution P encouraged frequent voids and ambulation, birth control options reviewed, undecide. Continue care. Lavera Guise, CNM

## 2011-10-11 MED ORDER — IBUPROFEN 600 MG PO TABS: 600.0000 mg | ORAL_TABLET | Freq: Four times a day (QID) | ORAL | Status: DC

## 2011-10-11 MED ORDER — OXYCODONE-ACETAMINOPHEN 5-325 MG PO TABS: 1.0000 | ORAL_TABLET | ORAL | Status: AC | PRN

## 2011-10-11 NOTE — Discharge Instructions (Signed)

## 2011-10-11 NOTE — Discharge Summary (Signed)
   Obstetric Discharge Summary Reason for Admission: rupture of membranes and and breech presentation Prenatal Procedures: NST Intrapartum Procedures: cesarean: low cervical, transverse Postpartum Procedures: none Complications-Operative and Postpartum: none  Temp:  [97.9 F (36.6 C)-98.4 F (36.9 C)] 98.4 F (36.9 C) (04/09 0555) Pulse Rate:  [84-99] 84  (04/09 0555) Resp:  [20] 20  (04/09 0555) BP: (118-134)/(78-84) 118/84 mmHg (04/09 0555) Hemoglobin  Date Value Range Status  10/09/2011 10.0* 12.0-15.0 (g/dL) Final     DELTA CHECK NOTED     REPEATED TO VERIFY     HCT  Date Value Range Status  10/09/2011 31.2* 36.0-46.0 (%) Final    Hospital Course:  Hospital Course: Admitted for cesarean secondary to SROM and breech. . Delivery was performed by Dr Pennie Rushing without difficulty. Patient and baby tolerated the procedure without difficulty, . Infant to FTN. Mother and infant then had an uncomplicated postpartum course, with breast feeding going well. Mom's physical exam was WNL, and she was discharged home in stable condition. Contraception plan was unknown.  She received adequate benefit from po pain medications.  Discharge Diagnoses: Term Pregnancy-delivered  Discharge Information: Date: 10/11/2011 Activity: pelvic rest Diet: routine Medications:  Medication List  As of 10/11/2011 11:49 AM   START taking these medications         ibuprofen 600 MG tablet   Commonly known as: ADVIL,MOTRIN   Take 1 tablet (600 mg total) by mouth every 6 (six) hours.      oxyCODONE-acetaminophen 5-325 MG per tablet   Commonly known as: PERCOCET   Take 1-2 tablets by mouth every 3 (three) hours as needed (moderate - severe pain).         CONTINUE taking these medications         clobetasol ointment 0.05 %   Commonly known as: TEMOVATE      prenatal multivitamin Tabs      ZZZQUIL PO         STOP taking these medications         valACYclovir 500 MG tablet          Where to get your  medications    These are the prescriptions that you need to pick up.   You may get these medications from any pharmacy.         ibuprofen 600 MG tablet   oxyCODONE-acetaminophen 5-325 MG per tablet           Condition: stable Instructions: refer to practice specific booklet Discharge to: home Follow-up Information    Schedule an appointment as soon as possible for a visit in 6 weeks to follow up.         Newborn Data: Live born  Information for the patient's newborn:  Angi, Goodell [[id number]]  female ; APGAR , ; weight ;  Home with mother.  Elzena Muston A 10/11/2011, 11:49 AM

## 2011-10-11 NOTE — Progress Notes (Signed)
Subjective: Postpartum Day 3: Cesarean Delivery Patient reports tolerating PO, + flatus, + BM and no problems voiding.    Objective: Vital signs in last 24 hours: Temp:  [97.9 F (36.6 C)-98.4 F (36.9 C)] 98.4 F (36.9 C) (04/09 0555) Pulse Rate:  [84-99] 84  (04/09 0555) Resp:  [20] 20  (04/09 0555) BP: (118-134)/(78-84) 118/84 mmHg (04/09 0555)  Physical Exam:  General: alert Lochia: appropriate Uterine Fundus: firm Incision: healing well, no significant drainage, no significant erythema DVT Evaluation: No evidence of DVT seen on physical exam.   Basename 10/09/11 0540  HGB 10.0*  HCT 31.2*    Assessment/Plan: Status post Cesarean section. Doing well postoperatively.  Continue current care. dxc home today  Papa Piercefield A 10/11/2011, 11:53 AM

## 2011-10-15 ENCOUNTER — Encounter (HOSPITAL_COMMUNITY): Payer: Self-pay | Admitting: *Deleted

## 2011-10-15 ENCOUNTER — Telehealth (HOSPITAL_COMMUNITY): Payer: Self-pay | Admitting: Obstetrics and Gynecology

## 2011-10-15 ENCOUNTER — Inpatient Hospital Stay (HOSPITAL_COMMUNITY)
Admission: AD | Admit: 2011-10-15 | Discharge: 2011-10-15 | Disposition: A | Discharge status: Home / Self Care | Payer: PRIVATE HEALTH INSURANCE | Source: Ambulatory Visit | Attending: Obstetrics and Gynecology | Admitting: Obstetrics and Gynecology

## 2011-10-15 DIAGNOSIS — R109 Unspecified abdominal pain: Secondary | ICD-10-CM | POA: Insufficient documentation

## 2011-10-15 DIAGNOSIS — O99893 Other specified diseases and conditions complicating puerperium: Secondary | ICD-10-CM | POA: Insufficient documentation

## 2011-10-15 DIAGNOSIS — Z9889 Other specified postprocedural states: Secondary | ICD-10-CM

## 2011-10-15 DIAGNOSIS — G589 Mononeuropathy, unspecified: Secondary | ICD-10-CM

## 2011-10-15 LAB — DIFFERENTIAL
Basophils Absolute: 0.1 10*3/uL (ref 0.0–0.1)
Lymphocytes Relative: 22 % (ref 12–46)
Monocytes Absolute: 0.8 10*3/uL (ref 0.1–1.0)
Neutro Abs: 5.2 10*3/uL (ref 1.7–7.7)
Neutrophils Relative %: 64 % (ref 43–77)

## 2011-10-15 LAB — CBC
HCT: 33 % — ABNORMAL LOW (ref 36.0–46.0)
Hemoglobin: 10.3 g/dL — ABNORMAL LOW (ref 12.0–15.0)
RDW: 14 % (ref 11.5–15.5)
WBC: 8.1 10*3/uL (ref 4.0–10.5)

## 2011-10-15 MED ORDER — KETOROLAC TROMETHAMINE 10 MG PO TABS: 10.0000 mg | ORAL_TABLET | Freq: Four times a day (QID) | ORAL | Status: AC | PRN

## 2011-10-15 NOTE — Telephone Encounter (Signed)
TC from patient--C/S on 4/8 for breech.  Area on incision is "red and puffy", tender to touch.  No drainage or fever.  Recommended eval in MAU. Patient will come.

## 2011-10-15 NOTE — MAU Note (Signed)
Pt reports having increased pain to c-section area.

## 2011-10-15 NOTE — MAU Note (Signed)
History   31 yo G1P1 s/p primary C/S for breech on 10/08/11 presented c/o increased left side pain and swelling above the incision site.  No drainage or disruption of incision noted.  Also had TDAP prior to d/c (4/9)--now with erythema and induration of left upper arm since injection.  Using Motrin and Percocet for left sided abdominal pain, but no benefit.  Took Motrin at 3pm, 2 Percocet early this am--neither with benefit.  Hx remarkable for: Recent LTCS for breech presentation Abscess on buttocks in early pregnancy (10 weeks)--treated with Keflex Positive GBS  Chief Complaint  Patient presents with  . Wound Check     OB History    Grav Para Term Preterm Abortions TAB SAB Ect Mult Living   1 1 1  0 0 0 0 0 0 1      Past Medical History  Diagnosis Date  . Hypoglycemia   . Headache     hx of, none recently  . Urinary tract infection   . Abnormal Pap smear     colposcopy- normal  . Degenerative disc disease     L4 AND L5    Past Surgical History  Procedure Date  . No past surgeries   . Cesarean section 10/08/2011    Procedure: CESAREAN SECTION;  Surgeon: Hal Morales, MD;  Location: [facility];  Service: Gynecology;  Laterality: N/A;  Primary Cesarean Section Delivery Boy @ [phone], Apgars 9/9    Family History  Problem Relation Age of Onset  . Anesthesia problems Neg Hx   . Diabetes Mother   . Sleep apnea Father     died from age 36  . Cancer Maternal Aunt   . Cancer Maternal Grandmother   . Stroke Maternal Grandfather     during surgery    History  Substance Use Topics  . Smoking status: Former Smoker -- 2 years  . Smokeless tobacco: Never Used   Comment: quit 2005  . Alcohol Use: Yes     no since pregnancy    Allergies: No Known Allergies  Prescriptions prior to admission  Medication Sig Dispense Refill  . clobetasol ointment (TEMOVATE) 0.05 % Apply 1 application topically every Monday, Wednesday, and Friday. Apply to affected area as directed      .  DiphenhydrAMINE HCl, Sleep, (ZZZQUIL PO) Take 30 mLs by mouth daily as needed. At bedtime for insomnia.      Marland Kitchen ibuprofen (ADVIL,MOTRIN) 600 MG tablet Take 1 tablet (600 mg total) by mouth every 6 (six) hours.  30 tablet  0  . oxyCODONE-acetaminophen (PERCOCET) 5-325 MG per tablet Take 1-2 tablets by mouth every 3 (three) hours as needed (moderate - severe pain).  30 tablet  0  . Prenatal Vit-Fe Fumarate-FA (PRENATAL MULTIVITAMIN) TABS Take 1 tablet by mouth daily. Patient takes chewable prenatal vitamins.         Physical Exam   Blood pressure 141/87, pulse 18, temperature 98.9 F (37.2 C), temperature source Oral, resp. rate 18, height 5\' 10"  (1.778 m), currently breastfeeding.  Chest clear Heart RRR without murmur Abd--area above and to the left of incision site is painful to touch (approx 5 cm squared area).  Area is without erythema or obvious induration, but is painful to palpation.   Incision site--CDI without any drainage, induration, or erythema. Uterus--approx 12 week size, NT, except in area previously noted above. Minimal bleeding. Extremities--left upper arm with indurated/erythematous area from shoulder down to elbow.  LE 1+ edema, negative Homan's  ED Course  1 week s/p scheduled LTCS for breech Left lower abdominal pain Local rxn to TDAP  Nigel Bridgeman, CNM, [place] 10/15/11 4:25pm  Addendum: Results for orders placed during the hospital encounter of 10/15/11 (from the past 24 hour(s))  CBC     Status: Abnormal   Collection Time   10/15/11  4:10 PM      Component Value Range   WBC 8.1  4.0 - 10.5 (K/uL)   RBC 4.03  3.87 - 5.11 (MIL/uL)   Hemoglobin 10.3 (*) 12.0 - 15.0 (g/dL)   HCT 16.1 (*) 09.6 - 46.0 (%)   MCV 81.9  78.0 - 100.0 (fL)   MCH 25.6 (*) 26.0 - 34.0 (pg)   MCHC 31.2  30.0 - 36.0 (g/dL)   RDW 04.5  40.9 - 81.1 (%)   Platelets 330  150 - 400 (K/uL)  DIFFERENTIAL     Status: Normal   Collection Time   10/15/11  4:10 PM      Component  Value Range   Neutrophils Relative 64  43 - 77 (%)   Neutro Abs 5.2  1.7 - 7.7 (K/uL)   Lymphocytes Relative 22  12 - 46 (%)   Lymphs Abs 1.8  0.7 - 4.0 (K/uL)   Monocytes Relative 10  3 - 12 (%)   Monocytes Absolute 0.8  0.1 - 1.0 (K/uL)   Eosinophils Relative 3  0 - 5 (%)   Eosinophils Absolute 0.2  0.0 - 0.7 (K/uL)   Basophils Relative 1  0 - 1 (%)   Basophils Absolute 0.1  0.0 - 0.1 (K/uL)   Consulted with Dr. Estanislado Pandy. Probably myofascial nerve entrapment. Patient will return here tomorrow at noon for injections of DepoMedrol and Lidocaine by Dr. Estanislado Pandy (anticipate 95% rate of relief). Will Rx Toradol for po use until then (next dose at 8pm, stop Ibuprophen). Patient agreeable with plan.  Nigel Bridgeman, CNM, [place] 10/15/11 4:45p

## 2011-10-15 NOTE — Discharge Instructions (Signed)
N

## 2011-10-16 ENCOUNTER — Encounter (HOSPITAL_COMMUNITY): Payer: Self-pay | Admitting: *Deleted

## 2011-10-16 ENCOUNTER — Inpatient Hospital Stay (HOSPITAL_COMMUNITY)
Admission: AD | Admit: 2011-10-16 | Discharge: 2011-10-16 | Disposition: A | Discharge status: Home / Self Care | Payer: PRIVATE HEALTH INSURANCE | Source: Ambulatory Visit | Attending: Obstetrics and Gynecology | Admitting: Obstetrics and Gynecology

## 2011-10-16 DIAGNOSIS — O909 Complication of the puerperium, unspecified: Secondary | ICD-10-CM | POA: Insufficient documentation

## 2011-10-16 MED ORDER — KETOROLAC TROMETHAMINE 30 MG/ML IJ SOLN: 30.0000 mg | Freq: Once | INTRAMUSCULAR | Status: DC

## 2011-10-16 MED ORDER — KETOROLAC TROMETHAMINE 30 MG/ML IJ SOLN
30.0000 mg | Freq: Once | INTRAMUSCULAR | Status: AC
  Administered 2011-10-16: 30 mg via INTRAMUSCULAR
  Filled 2011-10-16: qty 1

## 2011-10-16 MED ORDER — METHYLPREDNISOLONE ACETATE 80 MG/ML IJ SUSP
20.0000 mg | Freq: Once | INTRAMUSCULAR | Status: AC
  Administered 2011-10-16: 20 mg via INTRALESIONAL
  Filled 2011-10-16: qty 1

## 2011-10-16 NOTE — MAU Provider Note (Signed)
S: worsening pain at left upper side of incision 7 days post-op of elective C-section. Here for Depo-Medrol infiltration.  O: Trigger point identified and infiltrated with Depo-Medrol 20 mg in 10 cc of 1% Lidocaine. Toradol 30 mg IM also given.  A/P:  Myofascial nerve entrapment pain s/p c-section          Depo-Medrol infiltration completed. Pt already feeling better.          Follow-up with Dr Pennie Rushing in 7 days

## 2011-10-16 NOTE — MAU Note (Signed)
Here for injection by md for pain (lidocaine). Pt reports pain is about the same intensity as it was yesterday

## 2011-11-04 ENCOUNTER — Other Ambulatory Visit: Payer: Self-pay | Admitting: Obstetrics and Gynecology

## 2011-11-04 ENCOUNTER — Ambulatory Visit (INDEPENDENT_AMBULATORY_CARE_PROVIDER_SITE_OTHER): Payer: PRIVATE HEALTH INSURANCE | Admitting: Obstetrics and Gynecology

## 2011-11-04 ENCOUNTER — Encounter: Payer: Self-pay | Admitting: Obstetrics and Gynecology

## 2011-11-04 ENCOUNTER — Telehealth: Payer: Self-pay | Admitting: Obstetrics and Gynecology

## 2011-11-04 VITALS — BP 112/78 | Temp 98.8°F | Ht 70.0 in | Wt 257.0 lb

## 2011-11-04 DIAGNOSIS — L0291 Cutaneous abscess, unspecified: Secondary | ICD-10-CM

## 2011-11-04 DIAGNOSIS — B009 Herpesviral infection, unspecified: Secondary | ICD-10-CM

## 2011-11-04 MED ORDER — VALACYCLOVIR HCL 500 MG PO TABS: 500.0000 mg | ORAL_TABLET | Freq: Two times a day (BID) | ORAL | Status: DC

## 2011-11-04 MED ORDER — CEPHALEXIN 500 MG PO CAPS: 500.0000 mg | ORAL_CAPSULE | Freq: Four times a day (QID) | ORAL | Status: AC

## 2011-11-04 NOTE — Telephone Encounter (Signed)
[facility] DID C/S PER PT

## 2011-11-04 NOTE — Telephone Encounter (Signed)
Routed to triage 

## 2011-11-04 NOTE — Telephone Encounter (Signed)
Tc to pt per telephone call. Pt c/o yellow d/c from incision with a foul odor x couple of days. Temp-98.7 @1 :00am today. No pain. Mild numbness. Pt delivered on 10/08/11. Appt sched today at 2:00p with [facility]. Pt agrees.

## 2011-11-04 NOTE — Progress Notes (Signed)
Subjective:     Laura Calderon is a 31 y.o. female who presents c/o drainage from her CS incision.She underwent Csection on 4/6 for breech presentation in labor after SROM.  She had an uneventful postpatum course until she presented to MAU on 10/16/11 c/o paiin at the left pole of the incision.  She was treated with a Depo Medrol injection in Lidocaine with resolution of that pain.  2 days ago, she began having yellow malodorous discharge from the incision. She denies fever of chills at home I have fully reviewed the prenatal and intrapartum course.   The following portions of the patient's history were reviewed and updated as appropriate: allergies, current medications, past family history, past medical history, past social history, past surgical history and problem list.  Review of Systems Pertinent items are noted in HPI.   Objective:    BP 112/78  Temp 98.8 F (37.1 C)  Wt 257 lb (116.574 kg)  Breastfeeding? Yes  General:  alert, cooperative and no distress     Lungs: clear to auscultation bilaterally  Heart:  regular rate and rhythm, S1, S2 normal, no murmur  Abdomen: soft, non-tender; bowel sounds normal; no masses,  no organomegaly.  The incision line is intact throughout.  At the right pole of the incision, there is expresssable purulent material .  There is a cluster of vesicular lesions at the left inguinal region which is tender    Vulva:  normal  Vagina: normal vagina  Cervix:  normal  Corpus: normal size, contour, position, consistency, mobility, non-tender  Adnexa:  normal adnexa             Assessment:     Insicional abscess Questionable HSV outbreak    Plan:     1. Incision cleansed with betadine after wound and vesicular cultures done. 2. Keflex x 10 days, Valtrex to be restarted as pt discontinued it when she had a c/s rather than a vaginal delivery 3. Follow up 5/15 or as needed.    Haniya Fern P MD 11/04/2011 6:41 PM

## 2011-11-07 ENCOUNTER — Telehealth: Payer: Self-pay | Admitting: Obstetrics and Gynecology

## 2011-11-07 LAB — WOUND CULTURE
Gram Stain: NONE SEEN
Gram Stain: NONE SEEN

## 2011-11-07 NOTE — Telephone Encounter (Signed)
TC to pt.   States during the night noticed pad on incision was filled with blood.   Having small amt bleeding since then.  States is having increased pain but is not sure if incisional or abd.  Took Toradol 11/06/11 with little relief but has not taken any today.  Will try Ibuprofen.  T 95.9.  States still having drainage with foul odor.   Sched for eval w/[facility] 11/08/11.   To call after hrs with any concerns. Pt verbalizes comprehension.

## 2011-11-07 NOTE — Telephone Encounter (Signed)
cht was rerouted to Laura Calderon

## 2011-11-07 NOTE — Telephone Encounter (Signed)
This pt saw [facility] on 11/04/11 not ND pt

## 2011-11-08 ENCOUNTER — Encounter: Payer: PRIVATE HEALTH INSURANCE | Admitting: Obstetrics and Gynecology

## 2011-11-08 LAB — WOUND CULTURE: Gram Stain: NONE SEEN

## 2011-11-10 ENCOUNTER — Telehealth: Payer: Self-pay | Admitting: Obstetrics and Gynecology

## 2011-11-10 NOTE — Telephone Encounter (Signed)
Message copied by Mason Jim on Thu Nov 10, 2011  9:03 AM ------      Message from: Laurel Dimmer H      Created: Wed Nov 09, 2011  6:12 PM       Patient was scheduled for an incision check on yesterday and did not show up.  Please call to follow-up.            Thanks

## 2011-11-10 NOTE — Telephone Encounter (Signed)
TC to pt.    States bleeding is definitely less. Has noticed small hole in are of incision.  No fever.  Incision area "aches."  Pt declines appt at this time.  Will continue to monitor and call if feels she needs eval sooner than appt. 11/16/11.

## 2011-11-16 ENCOUNTER — Ambulatory Visit (INDEPENDENT_AMBULATORY_CARE_PROVIDER_SITE_OTHER): Payer: PRIVATE HEALTH INSURANCE | Admitting: Obstetrics and Gynecology

## 2011-11-16 ENCOUNTER — Encounter: Payer: Self-pay | Admitting: Obstetrics and Gynecology

## 2011-11-16 VITALS — BP 124/68 | Ht 70.0 in | Wt 256.0 lb

## 2011-11-16 DIAGNOSIS — L0291 Cutaneous abscess, unspecified: Secondary | ICD-10-CM

## 2011-11-16 DIAGNOSIS — L0390 Cellulitis, unspecified: Secondary | ICD-10-CM

## 2011-11-16 MED ORDER — CEFTRIAXONE SODIUM 1 G IJ SOLR
250.0000 mg | Freq: Once | INTRAMUSCULAR | Status: AC
  Administered 2011-11-16: 250 mg via INTRAMUSCULAR

## 2011-11-16 NOTE — Progress Notes (Signed)
Laura Calderon  is 5 weeks postpartum following a primary cesarean section, low transverse incision at 37 gestational weeks complicated by post op wound cellulitis.        Date: 10/08/2011 female baby named Laura Calderon delivered by [employer].  Breastfeeding: yes Bottlefeeding:  yes  Post-partum blues / depression:  no  EPDS score: 4  History of abnormal Pap:  yes  Last Pap: Date  08/2011 Gestational diabetes:  no  Contraception:  Desires IUD  Normal urinary function:  yes Normal GI function:  No pt states that she is very constipated. May have bm once a week Returning to work:  yes    Subjective:     Laura Calderon is a 31 y.o. female who presents for a postpartum visit.  I have fully reviewed the prenatal and intrapartum course. See not from 5/3 for description of post op cellulitis treatment.  The patient admits that she has not been taking her antibiotics as prescribed and at most has been taking them once daily. She has had improvement in the drainage and tenderness from her incision but feels there is still something of an odor. Patient is not sexually active.   The following portions of the patient's history were reviewed and updated as appropriate: allergies, current medications, past family history, past medical history, past social history, past surgical history and problem list.  Review of Systems Pertinent items are noted in HPI.   Objective:    BP 124/68  Ht 5\' 10"  (1.778 m)  Wt 256 lb (116.121 kg)  BMI 36.73 kg/m2  Breastfeeding? Yes  General:  alert, cooperative and no distress           Abdomen: soft, non-tender; bowel sounds normal; no masses,  no organomegaly.  Incision healing improved with no further drainage., but still small area of open skin   Vulva:  normal  Vagina: normal vagina  Cervix:  normal  Corpus: normal size, contour, position, consistency, mobility, non-tender  Adnexa:  normal adnexa             Assessment:     postpartum exam  complicated by post op wound cellulitis, improving Pap smear not done at today's visit.   Plan:  Pt requests parenteral ATB as she has been unable to take oral med as precribed  Rocephin 250mg  IM now.  Continue Keflex as tolerated . Contraception: IUD Mirena chosen. Will place after precert. . Follow up  For IUD placement    Lamia Mariner P MD 11/16/2011 2:50 PM

## 2011-11-16 NOTE — Patient Instructions (Signed)
iudLevonorgestrel intrauterine device (IUD) What is this medicine? LEVONORGESTREL IUD (LEE voe nor jes trel) is a contraceptive (birth control) device. It is used to prevent pregnancy and to treat heavy bleeding that occurs during your period. It can be used for up to 5 years. This medicine may be used for other purposes; ask your health care provider or pharmacist if you have questions. What should I tell my health care provider before I take this medicine? They need to know if you have any of these conditions: -abnormal Pap smear -cancer of the breast, uterus, or cervix -diabetes -endometritis -genital or pelvic infection now or in the past -have more than one sexual partner or your partner has more than one partner -heart disease -history of an ectopic or tubal pregnancy -immune system problems -IUD in place -liver disease or tumor -problems with blood clots or take blood-thinners -use intravenous drugs -uterus of unusual shape -vaginal bleeding that has not been explained -an unusual or allergic reaction to levonorgestrel, other hormones, silicone, or polyethylene, medicines, foods, dyes, or preservatives -pregnant or trying to get pregnant -breast-feeding How should I use this medicine? This device is placed inside the uterus by a health care professional. Talk to your pediatrician regarding the use of this medicine in children. Special care may be needed. Overdosage: If you think you have taken too much of this medicine contact a poison control center or emergency room at once. NOTE: This medicine is only for you. Do not share this medicine with others. What if I miss a dose? This does not apply. What may interact with this medicine? Do not take this medicine with any of the following medications: -amprenavir -bosentan -fosamprenavir This medicine may also interact with the following medications: -aprepitant -barbiturate medicines for inducing sleep or treating  seizures -bexarotene -griseofulvin -medicines to treat seizures like carbamazepine, ethotoin, felbamate, oxcarbazepine, phenytoin, topiramate -modafinil -pioglitazone -rifabutin -rifampin -rifapentine -some medicines to treat HIV infection like atazanavir, indinavir, lopinavir, nelfinavir, tipranavir, ritonavir -St. John's wort -warfarin This list may not describe all possible interactions. Give your health care provider a list of all the medicines, herbs, non-prescription drugs, or dietary supplements you use. Also tell them if you smoke, drink alcohol, or use illegal drugs. Some items may interact with your medicine. What should I watch for while using this medicine? Visit your doctor or health care professional for regular check ups. See your doctor if you or your partner has sexual contact with others, becomes HIV positive, or gets a sexual transmitted disease. This product does not protect you against HIV infection (AIDS) or other sexually transmitted diseases. You can check the placement of the IUD yourself by reaching up to the top of your vagina with clean fingers to feel the threads. Do not pull on the threads. It is a good habit to check placement after each menstrual period. Call your doctor right away if you feel more of the IUD than just the threads or if you cannot feel the threads at all. The IUD may come out by itself. You may become pregnant if the device comes out. If you notice that the IUD has come out use a backup birth control method like condoms and call your health care provider. Using tampons will not change the position of the IUD and are okay to use during your period. What side effects may I notice from receiving this medicine? Side effects that you should report to your doctor or health care professional as soon as possible: -allergic reactions  like skin rash, itching or hives, swelling of the face, lips, or tongue -fever, flu-like symptoms -genital sores -high  blood pressure -no menstrual period for 6 weeks during use -pain, swelling, warmth in the leg -pelvic pain or tenderness -severe or sudden headache -signs of pregnancy -stomach cramping -sudden shortness of breath -trouble with balance, talking, or walking -unusual vaginal bleeding, discharge -yellowing of the eyes or skin Side effects that usually do not require medical attention (report to your doctor or health care professional if they continue or are bothersome): -acne -breast pain -change in sex drive or performance -changes in weight -cramping, dizziness, or faintness while the device is being inserted -headache -irregular menstrual bleeding within first 3 to 6 months of use -nausea This list may not describe all possible side effects. Call your doctor for medical advice about side effects. You may report side effects to FDA at 1-800-FDA-1088. Where should I keep my medicine? This does not apply. NOTE: This sheet is a summary. It may not cover all possible information. If you have questions about this medicine, talk to your doctor, pharmacist, or health care provider.  2012, Elsevier/Gold Standard. (07/11/2008 6:39:08 PM)   Intrauterine Device Insertion Most often, an intrauterine device (IUD) is inserted into the uterus to prevent pregnancy. There are 2 types of IUDs available:  Copper IUD. This type of IUD creates an environment that is not favorable to sperm survival. The mechanism of action of the copper IUD is not known for certain. It can stay in place for 10 years.   Hormone IUD. This type of IUD contains the hormone progestin (synthetic progesterone). The progestin thickens the cervical mucus and prevents sperm from entering the uterus, and it also thins the uterine lining. There is no evidence that the hormone IUD prevents implantation. The hormone IUD can stay in place for up to 5 years.  An IUD is the most cost-effective birth control if left in place for the full  duration. It may be removed at any time. LET YOUR CAREGIVER KNOW ABOUT:  Sensitivity to metals.   Medicines taken including herbs, eyedrops, over-the-counter medicines, and creams.   Use of steroids (by mouth or creams).   Previous problems with anesthetics or numbing medicine.   Previous gynecological surgery.   History of blood clots or clotting disorders.   Possibility of pregnancy.   Menstrual irregularities.   Concerns regarding unusual vaginal discharge or odors.   Previous experience with an IUD.   Other health problems.  RISKS AND COMPLICATIONS  Accidental puncture (perforation) of the uterus.   Accidental placement of the IUD either in the muscle layer of the uterus (myometrium) or outside the uterus. If this happen, the IUD can be found essentially floating around the bowels. When this happens, the IUD must be taken out surgically.   The IUD may fall out of the uterus (expulsion). This is more common in women who have recently had a child.    Pregnancy in the fallopian tube (ectopic).  BEFORE THE PROCEDURE  Schedule the IUD insertion for when you will have your menstrual period or right after, to make sure you are not pregnant. Placement of the IUD is better tolerated shortly after a menstrual cycle.   You may need to take tests or be examined to make sure you are not pregnant.   You may be required to take a pregnancy test.   You may be required to get checked for sexually transmitted infections (STIs) prior to placement. Placing an  IUD in someone who has an infection can make an infection worse.   You may be given a pain reliever to take 1 or 2 hours before the procedure.   An exam will be performed to determine the size and position of your uterus.   Ask your caregiver about changing or stopping your regular medicines.  PROCEDURE   A tool (speculum) is placed in the vagina. This allows your caregiver to see the lower part of the uterus (cervix).   The  cervix is prepped with a medicine that lowers the risk of infection.   You may be given a medicine to numb each side of the cervix (intracervical or paracervical block). This is used to block and control any discomfort with insertion.   A tool (uterine sound) is inserted into the uterus to determine the length of the uterine cavity and the direction the uterus may be tilted.   A slim instrument (IUD inserter) is inserted through the cervical canal and into your uterus.   The IUD is placed in the uterine cavity and the insertion device is removed.   The nylon string that is attached to the IUD, and used for eventual IUD removal, is trimmed. It is trimmed so that it lays high in the vagina, just outside the cervix.  AFTER THE PROCEDURE  You may have bleeding after the procedure. This is normal. It varies from light spotting for a few days to menstrual-like bleeding.   You may have mild cramping.   Practice checking the string coming out of the cervix to make sure the IUD remains in the uterus. If you cannot feel the string, you should schedule a "string check" with your caregiver.   If you had a hormone IUD inserted, expect that your period may be lighter or nonexistent within a year's time (though this is not always the case). There may be delayed fertility with the hormone IUD as a result of its progesterone effect. When you are ready to become pregnant, it is suggested to have the IUD removed up to 1 year in advance.   Yearly exams are advised.  Document Released: 02/16/2011 Document Revised: 06/09/2011 Document Reviewed: 02/16/2011 [facility] Patient Information 2012 [place], [place].

## 2011-11-24 ENCOUNTER — Ambulatory Visit (INDEPENDENT_AMBULATORY_CARE_PROVIDER_SITE_OTHER): Payer: PRIVATE HEALTH INSURANCE | Admitting: Obstetrics and Gynecology

## 2011-11-24 DIAGNOSIS — Z975 Presence of (intrauterine) contraceptive device: Secondary | ICD-10-CM

## 2011-11-24 DIAGNOSIS — Z309 Encounter for contraceptive management, unspecified: Secondary | ICD-10-CM

## 2011-11-24 DIAGNOSIS — IMO0001 Reserved for inherently not codable concepts without codable children: Secondary | ICD-10-CM

## 2011-11-24 NOTE — Progress Notes (Signed)
PT IS INSTRUCTED NOT TO HAVE UNPROTECTED I/C UNTIL INSERTION DATE  11/29/11. WILL DO QUANT TODAY PER EP. RESCHEDULED PT UNTIL NEXT TUES. DUE TO IUD PROTOCOL. PT UNDERSTANDS.

## 2011-11-25 NOTE — Progress Notes (Signed)
Patient to return next week for insertion as it will be able to say that she has not had unprotected intercourse in the previous 14 days.  Then, if UPT is negative she may receive the Mirena IUD.  IUD Instruction sheet was given

## 2011-11-29 ENCOUNTER — Ambulatory Visit (INDEPENDENT_AMBULATORY_CARE_PROVIDER_SITE_OTHER): Payer: PRIVATE HEALTH INSURANCE | Admitting: Obstetrics and Gynecology

## 2011-11-29 ENCOUNTER — Encounter: Payer: Self-pay | Admitting: Obstetrics and Gynecology

## 2011-11-29 VITALS — BP 114/80 | HR 74 | Ht 70.0 in | Wt 154.0 lb

## 2011-11-29 DIAGNOSIS — R51 Headache: Secondary | ICD-10-CM

## 2011-11-29 DIAGNOSIS — IMO0002 Reserved for concepts with insufficient information to code with codable children: Secondary | ICD-10-CM

## 2011-11-29 DIAGNOSIS — Z30430 Encounter for insertion of intrauterine contraceptive device: Secondary | ICD-10-CM

## 2011-11-29 DIAGNOSIS — N390 Urinary tract infection, site not specified: Secondary | ICD-10-CM

## 2011-11-29 DIAGNOSIS — R87619 Unspecified abnormal cytological findings in specimens from cervix uteri: Secondary | ICD-10-CM

## 2011-11-29 DIAGNOSIS — E162 Hypoglycemia, unspecified: Secondary | ICD-10-CM

## 2011-11-29 DIAGNOSIS — R6889 Other general symptoms and signs: Secondary | ICD-10-CM

## 2011-11-29 LAB — POCT URINE PREGNANCY: Preg Test, Ur: NEGATIVE

## 2011-11-29 MED ORDER — LEVONORGESTREL 20 MCG/24HR IU IUD
INTRAUTERINE_SYSTEM | Freq: Once | INTRAUTERINE | Status: AC
  Administered 2011-11-29: 16:00:00 via INTRAUTERINE

## 2011-11-29 NOTE — Patient Instructions (Signed)
Follow up as scheduled in 4 weeks  Call [facility] [phone]:  -for temperature of 100.4 degrees Fahrenheit or more -pain not improved with over the counter pain medications (Ibuprofen, Advil, Aleve,        Tylenol or acetaminophen) -for excessive bleeding (more than a usual period) -for any other concerns  Do not place anything in your vagina for the next 7 days

## 2011-11-29 NOTE — Progress Notes (Signed)
IUD INSERTION NOTE Laura Calderon is a 31 y.o. female G1P1001 who presents for IUD insertion.  Consent signed after risks and benefits were reviewed including but not limited to bleeding, infection and risk of uterine perforation that may require additional surgery to remove.  LMP: No LMP recorded. Patient is not currently having periods (Reason: Lactating). ZOX:[id number]  IUD  (Mirena  Paragard)  LOT NUMBER: [id number]  Prepped with Betadine, Hurricane gel applied to anterior lip of cervix prior to placement of tenaculum to the same. Uterus sounded at  8.5 cm Insertion of MIRENA IUD per protocol without any complications  A: Mirena Inserrtion  Patient instructed to call with temperature > or = 100.4 degrees Fahrenheit, pain not improved with pain medications taken as instructed or for excessive bleeding.  Patient advised to not place anything in the vagina for 7 days after insertion.  Patient instructed to check IUD strings after each menstrual cycle   Follow-up:  4 weeks   Akita Maxim PA-C 11/29/2011 3:17 PM

## 2011-11-30 ENCOUNTER — Telehealth: Payer: Self-pay | Admitting: Obstetrics and Gynecology

## 2011-11-30 NOTE — Telephone Encounter (Signed)
Spoke with pt Laura Calderon concerns pt states had c section 10/08/11 incision now drainage  in two places no odor no fever pt has appt 12/01/11 at 845 ok per cw pt voice understanding

## 2011-11-30 NOTE — Telephone Encounter (Signed)
Niccole/epic  °

## 2011-12-01 ENCOUNTER — Encounter: Payer: Self-pay | Admitting: Obstetrics and Gynecology

## 2011-12-01 ENCOUNTER — Ambulatory Visit (INDEPENDENT_AMBULATORY_CARE_PROVIDER_SITE_OTHER): Payer: PRIVATE HEALTH INSURANCE | Admitting: Obstetrics and Gynecology

## 2011-12-01 VITALS — BP 114/64 | Temp 99.1°F | Ht 70.0 in | Wt 267.0 lb

## 2011-12-01 DIAGNOSIS — L739 Follicular disorder, unspecified: Secondary | ICD-10-CM

## 2011-12-01 DIAGNOSIS — L738 Other specified follicular disorders: Secondary | ICD-10-CM

## 2011-12-01 NOTE — Progress Notes (Signed)
Surgery: C-section  Date: 10/08/2011  Eating a regular diet without difficulty. Bowel movements are once or twice weekly.  Pain is relieved with ibuprofen 600 mg  Bladder function is returned to normal. Vaginal bleeding: started when mirena was inserted on Tuesday. Vaginal discharge: no vaginal discharge  Pt c/o lump at incision, and bleeding from incision. C/o pain and tenderness. Pt also c/o temp staying low states that it is staying around 97.3.   Had Mirena placed 2 days ago.  Noticed pain and small amt of drainage in incision area yesterday.  Had good improvement in incision s/p atb rx 11/04/11.  Exam:  BP 114/64  Temp(Src) 99.1 F (37.3 C) (Oral)  Ht 5\' 10"  (1.778 m)  Wt 267 lb (121.11 kg)  BMI 38.31 kg/m2  Breastfeeding? Yes  Incision clean and dry.  Ingrown hair above and below incision with surrounding induration  Assessment: Well healed incision with mild folliculitis  Plan:  Ingrown hair tweased out after alcohol prep. Keep area clean and dry Declines Mirena ck today.  Will keep her 4 wk appt.

## 2011-12-01 NOTE — Patient Instructions (Signed)
Keep incision clean and dry.

## 2011-12-05 ENCOUNTER — Encounter: Payer: Self-pay | Admitting: Obstetrics and Gynecology

## 2011-12-06 ENCOUNTER — Encounter: Payer: PRIVATE HEALTH INSURANCE | Admitting: Obstetrics and Gynecology

## 2011-12-09 NOTE — Progress Notes (Signed)
No Show

## 2011-12-28 ENCOUNTER — Encounter: Payer: PRIVATE HEALTH INSURANCE | Admitting: Obstetrics and Gynecology

## 2011-12-29 ENCOUNTER — Ambulatory Visit (INDEPENDENT_AMBULATORY_CARE_PROVIDER_SITE_OTHER): Payer: PRIVATE HEALTH INSURANCE | Admitting: Obstetrics and Gynecology

## 2011-12-29 ENCOUNTER — Encounter: Payer: Self-pay | Admitting: Obstetrics and Gynecology

## 2011-12-29 VITALS — BP 112/70 | HR 72 | Wt 258.0 lb

## 2011-12-29 DIAGNOSIS — Z30431 Encounter for routine checking of intrauterine contraceptive device: Secondary | ICD-10-CM

## 2011-12-29 DIAGNOSIS — K649 Unspecified hemorrhoids: Secondary | ICD-10-CM

## 2011-12-29 LAB — POCT URINE PREGNANCY: Preg Test, Ur: NEGATIVE

## 2011-12-29 MED ORDER — LIDOCAINE-HYDROCORTISONE ACE 3-1 % RE KIT: 1.0000 "application " | PACK | Freq: Two times a day (BID) | RECTAL | Status: DC

## 2011-12-29 NOTE — Progress Notes (Signed)
31 YO with IUD insertion 11/29/11 returns for follow up.  Just stopped bleeding 1 week ago.  Complains of constipation and possible hemorrhoids. Anal itching and pain occasional blood on tissue.  Lastly requests trimming of strings.   O: Pelvic: EGBUS-wnl, vagina-mildly atrophic, cervix-strings visible-trimmed to 1 cm, uterus-retroverted, ULNS, adnexae-no masses or tenderness  Anus: single, hemorrhoid at 12 o'clock    A: IUD Check     Strings Trimmed     Hemorrhoids   P: AnaMantle 1 kit apply to ano-rectal area bid          Miralax as directed       Reviewed bowel retraining      RTO-AEx or prn

## 2012-02-10 ENCOUNTER — Ambulatory Visit (HOSPITAL_COMMUNITY): Payer: PRIVATE HEALTH INSURANCE

## 2012-03-01 ENCOUNTER — Ambulatory Visit (INDEPENDENT_AMBULATORY_CARE_PROVIDER_SITE_OTHER): Payer: PRIVATE HEALTH INSURANCE | Admitting: Obstetrics and Gynecology

## 2012-03-01 ENCOUNTER — Encounter: Payer: Self-pay | Admitting: Obstetrics and Gynecology

## 2012-03-01 VITALS — BP 120/76 | Ht 70.0 in | Wt 261.0 lb

## 2012-03-01 DIAGNOSIS — N762 Acute vulvitis: Secondary | ICD-10-CM

## 2012-03-01 DIAGNOSIS — N760 Acute vaginitis: Secondary | ICD-10-CM

## 2012-03-01 MED ORDER — LIDOCAINE HCL 2 % EX GEL: Freq: Two times a day (BID) | CUTANEOUS | Status: DC

## 2012-03-01 NOTE — Patient Instructions (Signed)
Take valtrex twice a day for three days Apply lidocaine twice a day for three to five days as needed Apply temovate each day for one week.  Then every other day for a week.  Then three times a week

## 2012-03-01 NOTE — Progress Notes (Signed)
Pt c/o rash on vagina for two months.  She has severe itching.  She has scratched it until it bleeds.  She does have h/o HSV Physical Examination: General appearance - alert, well appearing, and in no distress Abdomen - soft, nontender, nondistended, no masses or organomegaly Pelvic - normal external genitalia, vulva, vagina, cervix, uterus and adnexa, VULVA: vulvar excoriation s flat and vessicular, vulvar lesion c/w HSV, VAGINA: normal appearing vagina with normal color and discharge, no lesions, CERVIX: normal appearing cervix without discharge or lesions, UTERUS: uterus is normal size, shape, consistency and nontender, ADNEXA: normal adnexa in size, nontender and no masses Vulvitis C/w HSV start valtrex, lidocaine prn and temovate prn RT 2-4 weeks for vulvar bx

## 2012-03-12 ENCOUNTER — Telehealth: Payer: Self-pay | Admitting: Obstetrics and Gynecology

## 2012-03-12 NOTE — Telephone Encounter (Signed)
Spoke with pt Laura Calderon msg pt wants to know if theres anything she need to do before having vulvar biopsy advised pt can take tylenol or ibuprofen pt voice understanding

## 2012-03-16 ENCOUNTER — Encounter: Payer: Self-pay | Admitting: Obstetrics and Gynecology

## 2012-03-21 ENCOUNTER — Encounter: Payer: Self-pay | Admitting: Obstetrics and Gynecology

## 2012-03-21 ENCOUNTER — Ambulatory Visit (INDEPENDENT_AMBULATORY_CARE_PROVIDER_SITE_OTHER): Payer: PRIVATE HEALTH INSURANCE | Admitting: Obstetrics and Gynecology

## 2012-03-21 VITALS — BP 120/78 | Wt 261.0 lb

## 2012-03-21 DIAGNOSIS — N760 Acute vaginitis: Secondary | ICD-10-CM

## 2012-03-21 DIAGNOSIS — R21 Rash and other nonspecific skin eruption: Secondary | ICD-10-CM

## 2012-03-21 DIAGNOSIS — N762 Acute vulvitis: Secondary | ICD-10-CM

## 2012-03-21 NOTE — Progress Notes (Signed)
Pt is here for vulvar biopsy. Pt with persistent vulvitis Left labium majorum cleaned with betadine.  2% lidocaine jelly applied 2cc 1% buffered lidocaine used for anesthesia Vulvar bx done with 4mm punch bx.  Hemostasis obtained with pressure and silver nitrate

## 2012-03-23 LAB — PATHOLOGY

## 2012-03-27 ENCOUNTER — Encounter: Payer: PRIVATE HEALTH INSURANCE | Admitting: Obstetrics and Gynecology

## 2012-04-04 ENCOUNTER — Telehealth: Payer: Self-pay | Admitting: Obstetrics and Gynecology

## 2012-04-04 NOTE — Telephone Encounter (Signed)
Pt notified of no malignancy found on vulvar bx. Consulted NG to see if ND brings pts in for f/u.  Not normally done.  Will call pt if needed.  ld

## 2012-04-10 ENCOUNTER — Telehealth: Payer: Self-pay

## 2012-04-10 NOTE — Telephone Encounter (Signed)
Spoke with pt rgd labs informed biopsy benign pt voice understanding

## 2012-04-10 NOTE — Telephone Encounter (Signed)
Message copied by Rolla Plate on Tue Apr 10, 2012  1:22 PM ------      Message from: Jaymes Graff      Created: Sun Apr 08, 2012  9:21 PM       Please tell the pt her bx was benign      ----- Message -----         From: [facility]         Sent: 03/23/2012   5:10 PM           To: Michael Litter, MD

## 2012-05-11 ENCOUNTER — Telehealth: Payer: Self-pay | Admitting: Obstetrics and Gynecology

## 2012-05-11 NOTE — Telephone Encounter (Signed)
Lm on pt's vm to cb rgd flu shot . Pt had flu shot on 05/03/2011

## 2012-11-01 ENCOUNTER — Emergency Department (HOSPITAL_COMMUNITY)
Admission: EM | Admit: 2012-11-01 | Discharge: 2012-11-01 | Disposition: A | Discharge status: Home / Self Care | Payer: PRIVATE HEALTH INSURANCE | Attending: Emergency Medicine | Admitting: Emergency Medicine

## 2012-11-01 ENCOUNTER — Emergency Department (HOSPITAL_COMMUNITY): Payer: PRIVATE HEALTH INSURANCE

## 2012-11-01 ENCOUNTER — Encounter (HOSPITAL_COMMUNITY): Payer: Self-pay | Admitting: Emergency Medicine

## 2012-11-01 DIAGNOSIS — Z8611 Personal history of tuberculosis: Secondary | ICD-10-CM | POA: Insufficient documentation

## 2012-11-01 DIAGNOSIS — Z87891 Personal history of nicotine dependence: Secondary | ICD-10-CM | POA: Insufficient documentation

## 2012-11-01 DIAGNOSIS — R0602 Shortness of breath: Secondary | ICD-10-CM | POA: Insufficient documentation

## 2012-11-01 DIAGNOSIS — Z87440 Personal history of urinary (tract) infections: Secondary | ICD-10-CM | POA: Insufficient documentation

## 2012-11-01 DIAGNOSIS — Z8639 Personal history of other endocrine, nutritional and metabolic disease: Secondary | ICD-10-CM | POA: Insufficient documentation

## 2012-11-01 DIAGNOSIS — M25519 Pain in unspecified shoulder: Secondary | ICD-10-CM | POA: Insufficient documentation

## 2012-11-01 DIAGNOSIS — Z79899 Other long term (current) drug therapy: Secondary | ICD-10-CM | POA: Insufficient documentation

## 2012-11-01 DIAGNOSIS — K8020 Calculus of gallbladder without cholecystitis without obstruction: Secondary | ICD-10-CM

## 2012-11-01 DIAGNOSIS — Z862 Personal history of diseases of the blood and blood-forming organs and certain disorders involving the immune mechanism: Secondary | ICD-10-CM | POA: Insufficient documentation

## 2012-11-01 DIAGNOSIS — Z9889 Other specified postprocedural states: Secondary | ICD-10-CM | POA: Insufficient documentation

## 2012-11-01 DIAGNOSIS — Z8739 Personal history of other diseases of the musculoskeletal system and connective tissue: Secondary | ICD-10-CM | POA: Insufficient documentation

## 2012-11-01 DIAGNOSIS — R1013 Epigastric pain: Secondary | ICD-10-CM | POA: Insufficient documentation

## 2012-11-01 LAB — COMPREHENSIVE METABOLIC PANEL
ALT: 20 U/L (ref 0–35)
AST: 24 U/L (ref 0–37)
Albumin: 3.6 g/dL (ref 3.5–5.2)
Alkaline Phosphatase: 62 U/L (ref 39–117)
CO2: 24 mEq/L (ref 19–32)
Chloride: 104 mEq/L (ref 96–112)
Creatinine, Ser: 0.81 mg/dL (ref 0.50–1.10)
Potassium: 4 mEq/L (ref 3.5–5.1)
Sodium: 139 mEq/L (ref 135–145)
Total Bilirubin: 0.3 mg/dL (ref 0.3–1.2)

## 2012-11-01 LAB — CBC WITH DIFFERENTIAL/PLATELET
Basophils Absolute: 0.1 10*3/uL (ref 0.0–0.1)
Basophils Relative: 1 % (ref 0–1)
Lymphocytes Relative: 30 % (ref 12–46)
MCHC: 33.5 g/dL (ref 30.0–36.0)
Neutro Abs: 7 10*3/uL (ref 1.7–7.7)
Neutrophils Relative %: 63 % (ref 43–77)
Platelets: 263 10*3/uL (ref 150–400)
RDW: 13.3 % (ref 11.5–15.5)
WBC: 11 10*3/uL — ABNORMAL HIGH (ref 4.0–10.5)

## 2012-11-01 MED ORDER — HYDROCODONE-ACETAMINOPHEN 5-325 MG PO TABS: 2.0000 | ORAL_TABLET | ORAL | Status: DC | PRN

## 2012-11-01 NOTE — ED Notes (Signed)
Discharge instructions reviewed. Pt verbalized understanding.  

## 2012-11-01 NOTE — ED Provider Notes (Signed)
History     CSN: [id number]  Arrival date & time 11/01/12  1703   First MD Initiated Contact with Patient 11/01/12 1710      Chief Complaint  Patient presents with  . Chest Pain    (Consider location/radiation/quality/duration/timing/severity/associated sxs/prior treatment) HPI Comments: Patient comes to the ER for evaluation of abdominal and chest pain. Patient had sudden onset of cramping pain in the upper epigastric area and lower chest that lasted for about 30 minutes. Pain progressively worsened after it started she started having pain in the right shoulder area. She then became somewhat short of breath. All symptoms have resolved. Patient reports that she had a similar episode several weeks ago.  Patient is a 32 y.o. female presenting with chest pain.  Chest Pain Associated symptoms: abdominal pain and shortness of breath     Past Medical History  Diagnosis Date  . Hypoglycemia   . Headache     hx of, none recently  . Urinary tract infection   . Abnormal Pap smear     colposcopy- normal  . Degenerative disc disease     L4 AND L5  . Hx of colposcopy with cervical biopsy   . Tuberculosis 1999    Past Surgical History  Procedure Laterality Date  . No past surgeries    . Cesarean section  10/08/2011    Procedure: CESAREAN SECTION;  Surgeon: Hal Morales, MD;  Location: [facility];  Service: Gynecology;  Laterality: N/A;  Primary Cesarean Section Delivery Boy @ [phone], Apgars 9/9  . Root canal    . Wisdom tooth extraction      Family History  Problem Relation Age of Onset  . Anesthesia problems Neg Hx   . Diabetes Mother   . Anemia Mother   . Sleep apnea Father     died from age 5  . Hypertension Father   . Cancer Maternal Aunt   . Diabetes Maternal Aunt   . Stroke Maternal Aunt   . Lupus Maternal Aunt   . Cancer Maternal Grandmother   . Anemia Maternal Grandmother   . Diabetes Maternal Grandmother   . Stroke Maternal Grandfather     during surgery  .  Hypertension Paternal Grandmother   . Diabetes Paternal Grandmother   . Multiple sclerosis Paternal Aunt     History  Substance Use Topics  . Smoking status: Former Smoker -- 2 years  . Smokeless tobacco: Never Used     Comment: quit 2005  . Alcohol Use: No     Comment: no since pregnancy    OB History   Grav Para Term Preterm Abortions TAB SAB Ect Mult Living   1 1 1  0 0 0 0 0 0 1      Review of Systems  Respiratory: Positive for shortness of breath.   Cardiovascular: Positive for chest pain.  Gastrointestinal: Positive for abdominal pain.  All other systems reviewed and are negative.    Allergies  Review of patient's allergies indicates no known allergies.  Home Medications   Current Outpatient Rx  Name  Route  Sig  Dispense  Refill  . acetaminophen (TYLENOL) 325 MG tablet   Oral   Take 650 mg by mouth every 6 (six) hours as needed. For pain.         . clobetasol ointment (TEMOVATE) 0.05 %   Topical   Apply 1 application topically every Monday, Wednesday, and Friday. Apply to affected area as directed         .  DiphenhydrAMINE HCl, Sleep, (ZZZQUIL PO)   Oral   Take 30 mLs by mouth daily as needed. At bedtime for insomnia.         Marland Kitchen docusate sodium (COLACE) 100 MG capsule   Oral   Take 100 mg by mouth 3 (three) times daily as needed.         Marland Kitchen ibuprofen (ADVIL,MOTRIN) 600 MG tablet   Oral   Take 600 mg by mouth every 6 (six) hours as needed. For pain.         Marland Kitchen lidocaine (XYLOCAINE JELLY) 2 % jelly   Topical   Apply topically 2 (two) times daily. As needed   30 mL   0   . lidocaine-hydrocortisone (ANAMANTLE) 3-1 % KIT   Rectal   Place 1 application rectally 2 (two) times daily.   1 each   1   . polyethylene glycol (MIRALAX / GLYCOLAX) packet   Oral   Take 17 g by mouth daily.         . Prenatal Vit-Fe Fumarate-FA (PRENATAL MULTIVITAMIN) TABS   Oral   Take 1 tablet by mouth daily. Patient takes chewable prenatal vitamins.          . valACYclovir (VALTREX) 500 MG tablet   Oral   Take 1 tablet (500 mg total) by mouth 2 (two) times daily.   60 tablet   3     BP 136/79  Pulse 77  Temp(Src) 98 F (36.7 C) (Oral)  Resp 16  Ht 5\' 11"  (1.803 m)  Wt 250 lb (113.399 kg)  BMI 34.88 kg/m2  SpO2 99%  Physical Exam  Constitutional: She is oriented to person, place, and time. She appears well-developed and well-nourished. No distress.  HENT:  Head: Normocephalic and atraumatic.  Right Ear: Hearing normal.  Nose: Nose normal.  Mouth/Throat: Oropharynx is clear and moist and mucous membranes are normal.  Eyes: Conjunctivae and EOM are normal. Pupils are equal, round, and reactive to light.  Neck: Normal range of motion. Neck supple.  Cardiovascular: Normal rate, regular rhythm, S1 normal and S2 normal.  Exam reveals no gallop and no friction rub.   No murmur heard. Pulmonary/Chest: Effort normal and breath sounds normal. No respiratory distress. She exhibits no tenderness.  Abdominal: Soft. Normal appearance and bowel sounds are normal. There is no hepatosplenomegaly. There is no tenderness. There is no rebound, no guarding, no tenderness at McBurney's point and negative Murphy's sign. No hernia.  Musculoskeletal: Normal range of motion.  Neurological: She is alert and oriented to person, place, and time. She has normal strength. No cranial nerve deficit or sensory deficit. Coordination normal. GCS eye subscore is 4. GCS verbal subscore is 5. GCS motor subscore is 6.  Skin: Skin is warm, dry and intact. No rash noted. No cyanosis.  Psychiatric: She has a normal mood and affect. Her speech is normal and behavior is normal. Thought content normal.    ED Course  Procedures (including critical care time)  EKG:  Date: 11/01/2012  Rate: 67  Rhythm: normal sinus rhythm  QRS Axis: normal  Intervals: normal  ST/T Wave abnormalities: normal  Conduction Disutrbances: none  Narrative Interpretation:  unremarkable      Labs Reviewed  CBC WITH DIFFERENTIAL  COMPREHENSIVE METABOLIC PANEL  TROPONIN I  D-DIMER, QUANTITATIVE  LIPASE, BLOOD   Dg Chest 2 View  11/01/2012  *RADIOLOGY REPORT*  Clinical Data: Chest pain. Shortness of breath.  CHEST - 2 VIEW  Comparison: 12/27/2006  Findings: Cardiac and  mediastinal contours appear normal.  The lungs appear clear.  No pleural effusion is identified. Mildly irregular right acromion, query os acromiale.  IMPRESSION:  No significant abnormality identified.   Original Report Authenticated By: Gaylyn Rong, M.D.      Diagnosis: Cholelithiasis    MDM  Patient comes to the ER for evaluation of 2 episodes of intermittent abdominal discomfort radiating to the right shoulder. Symptoms lasted approximately 30 minutes and then resolve spontaneously. She did report having some shortness of breath with the episodes, but at arrival to the ER pain is completely resolved she has not had any breathing difficulty. She has no cardiac risk factors. She also has no risk factors for PE. Patient was sent for ultrasound to rule out gallbladder disease and she does have cholelithiasis without cholecystitis. I believe this satisfactorily explains the patient's symptoms. EKG was unremarkable. Reportedly her d-dimer canceled as I do not have any suspicion for cardiopulmonary causes of the pain. Patient will be referred to general surgery. Return for recurrent pain that does not resolve with prescribed hydrocodone.        Gilda Crease, MD 11/01/12 [phone]

## 2012-11-01 NOTE — ED Notes (Signed)
Pt c/o epigastric pain that radiates to rt shoulder. Pt rates pain 10/10. Pt was given 324 asa. Denies any pain at this time. 116/70, 20g LFA.

## 2012-11-01 NOTE — ED Notes (Signed)
Family at bedside. 

## 2012-11-01 NOTE — ED Notes (Signed)
Patient transported to X-ray 

## 2012-11-02 ENCOUNTER — Ambulatory Visit (INDEPENDENT_AMBULATORY_CARE_PROVIDER_SITE_OTHER): Payer: PRIVATE HEALTH INSURANCE | Admitting: General Surgery

## 2012-11-02 ENCOUNTER — Encounter (INDEPENDENT_AMBULATORY_CARE_PROVIDER_SITE_OTHER): Payer: Self-pay | Admitting: General Surgery

## 2012-11-02 VITALS — BP 132/88 | HR 68 | Temp 97.9°F | Resp 16 | Ht 70.5 in | Wt 262.8 lb

## 2012-11-02 DIAGNOSIS — K8020 Calculus of gallbladder without cholecystitis without obstruction: Secondary | ICD-10-CM

## 2012-11-02 NOTE — Progress Notes (Signed)
Patient ID: Laura Calderon, female   DOB: 11/14/1980, 32 y.o.   MRN: [id number]  Chief Complaint  Patient presents with  . New Evaluation    eval chole    HPI Laura Calderon is a 32 y.o. female.  This patient was referred by Dr. Pollina from the emergency room. She was seen last night after her second episode of epigastric abdominal pain the last 3 months. She said that yesterday after eating lunch she had severe epigastric cramping which radiated to the right shoulder and lasted about 20 minutes and then spontaneously resolved. She had some associated shortness of breath and chest tightness as well.  She went to the emergency room by EMS and by the time that she was in the emergency room her dizziness and shortness of breath and abdominal pain had improved. She had a dorsalis demonstrated cholelithiasis without evidence of acute cholecystitis. She denied any fevers or chills or nausea with this episode although she did have a similar episode about 3 months prior which left a little bit longer and she did have some associated nausea with this. She does take NSAIDs about 1 time per week for degenerative disc disease but she denies any heartburn symptoms. Also, she inquired about weight loss surgery because she was just recently diagnosed with "prediabetes". She has a BMI of 37.7 as well as degenerative disc disease and has a strong family history of diabetes. She says that she has tried several diets and really been unsuccessful in losing weight  HPI  Past Medical History  Diagnosis Date  . Hypoglycemia   . Headache     hx of, none recently  . Urinary tract infection   . Abnormal Pap smear     colposcopy- normal  . Degenerative disc disease     L4 AND L5  . Hx of colposcopy with cervical biopsy   . Tuberculosis 1999    Past Surgical History  Procedure Laterality Date  . No past surgeries    . Cesarean section  10/08/2011    Procedure: CESAREAN SECTION;  Surgeon: Vanessa P Haygood, MD;   Location: [facility];  Service: Gynecology;  Laterality: N/A;  Primary Cesarean Section Delivery Boy @ 0638, Apgars 9/9  . Root canal    . Wisdom tooth extraction      Family History  Problem Relation Age of Onset  . Anesthesia problems Neg Hx   . Diabetes Mother   . Anemia Mother   . Sleep apnea Father     died from age 41  . Hypertension Father   . Cancer Maternal Aunt     breast  . Diabetes Maternal Aunt   . Stroke Maternal Aunt   . Lupus Maternal Aunt   . Cancer Maternal Grandmother     breast  . Anemia Maternal Grandmother   . Diabetes Maternal Grandmother   . Stroke Maternal Grandfather     during surgery  . Hypertension Paternal Grandmother   . Diabetes Paternal Grandmother   . Multiple sclerosis Paternal Aunt     Social History History  Substance Use Topics  . Smoking status: Former Smoker -- 2 years  . Smokeless tobacco: Never Used     Comment: quit 2005  . Alcohol Use: No     Comment: no since pregnancy    No Known Allergies  Current Outpatient Prescriptions  Medication Sig Dispense Refill  . HYDROcodone-acetaminophen (NORCO/VICODIN) 5-325 MG per tablet Take 2 tablets by mouth every 4 (four) hours as needed   for pain.  10 tablet  0  . Multiple Vitamin (MULTIVITAMIN WITH MINERALS) TABS Take 1 tablet by mouth daily.       No current facility-administered medications for this visit.    Review of Systems Review of Systems All other review of systems negative or noncontributory except as stated in the HPI  Blood pressure 132/88, pulse 68, temperature 97.9 F (36.6 C), temperature source Temporal, resp. rate 16, height 5' 10.5" (1.791 m), weight 262 lb 12.8 oz (119.205 kg).  Physical Exam Physical Exam Physical Exam  Nursing note and vitals reviewed. Constitutional: She is oriented to person, place, and time. She appears well-developed and well-nourished. No distress.  HENT:  Head: Normocephalic and atraumatic.  Mouth/Throat: No oropharyngeal exudate.   Eyes: Conjunctivae and EOM are normal. Pupils are equal, round, and reactive to light. Right eye exhibits no discharge. Left eye exhibits no discharge. No scleral icterus.  Neck: Normal range of motion. Neck supple. No tracheal deviation present.  Cardiovascular: Normal rate, regular rhythm, normal heart sounds and intact distal pulses.   Pulmonary/Chest: Effort normal and breath sounds normal. No stridor. No respiratory distress. She has no wheezes.  Abdominal: Soft. Bowel sounds are normal. She exhibits no distension and no mass. There is no tenderness. There is no rebound and no guarding.  Musculoskeletal: Normal range of motion. She exhibits no edema and no tenderness.  Neurological: She is alert and oriented to person, place, and time.  Skin: Skin is warm and dry. No rash noted. She is not diaphoretic. No erythema. No pallor.  Psychiatric: She has a normal mood and affect. Her behavior is normal. Judgment and thought content normal.    Data Reviewed Us, labs  Assessment    Systematic cholelithiasis I think that her symptoms sound fairly typical for symptomatically lithiasis. Given her ultrasound findings and her history I have offered her cholecystectomy for possible relief and prevention of future episodes.  We discussed the options of continued observation of versus surgery as well as the risks and benefits of the procedure. The risks of infection, bleeding, pain, persistent symptoms, scarring, injury to bowel or bile ducts, retained stone, diarrhea, need for additional procedures, and need for open surgery discussed with the patient. She would like to go ahead and schedule elective cholecystectomy. Morbid obesity with obesity related comorbidities of prediabetes, degenerative disc disease I think it is reasonable to look into possible weight loss surgery for her especially given her new diagnosis of "prediabetes". I briefly discussed the procedures and recommended that she attended one  of our information sessions to learn more about the surgical options were obesity and if this is something that she is interested in that it be happy to see her back to discuss this further      Plan    We will go ahead and set her up for laparoscopic cholecystectomy        Braidan Ricciardi DAVID 11/02/2012, 10:49 AM    

## 2012-11-07 ENCOUNTER — Encounter (HOSPITAL_COMMUNITY): Payer: Self-pay | Admitting: Pharmacy Technician

## 2012-11-12 ENCOUNTER — Encounter (HOSPITAL_COMMUNITY)
Admission: RE | Admit: 2012-11-12 | Discharge: 2012-11-12 | Disposition: A | Discharge status: Home / Self Care | Payer: PRIVATE HEALTH INSURANCE | Source: Ambulatory Visit | Attending: General Surgery | Admitting: General Surgery

## 2012-11-12 ENCOUNTER — Encounter (HOSPITAL_COMMUNITY): Payer: Self-pay

## 2012-11-12 VITALS — BP 121/85 | HR 77 | Temp 98.6°F | Resp 20 | Wt 260.8 lb

## 2012-11-12 DIAGNOSIS — K8020 Calculus of gallbladder without cholecystitis without obstruction: Secondary | ICD-10-CM

## 2012-11-12 LAB — CBC WITH DIFFERENTIAL/PLATELET
Basophils Relative: 2 % — ABNORMAL HIGH (ref 0–1)
Eosinophils Relative: 4 % (ref 0–5)
Hemoglobin: 14.1 g/dL (ref 12.0–15.0)
Lymphs Abs: 3.3 10*3/uL (ref 0.7–4.0)
MCV: 79.3 fL (ref 78.0–100.0)
Monocytes Relative: 5 % (ref 3–12)
Platelets: 298 10*3/uL (ref 150–400)
RBC: 5.37 MIL/uL — ABNORMAL HIGH (ref 3.87–5.11)
WBC: 8.8 10*3/uL (ref 4.0–10.5)

## 2012-11-12 LAB — COMPREHENSIVE METABOLIC PANEL
AST: 19 U/L (ref 0–37)
Albumin: 3.7 g/dL (ref 3.5–5.2)
Alkaline Phosphatase: 61 U/L (ref 39–117)
BUN: 15 mg/dL (ref 6–23)
Chloride: 104 mEq/L (ref 96–112)
Potassium: 4.6 mEq/L (ref 3.5–5.1)
Total Bilirubin: 0.6 mg/dL (ref 0.3–1.2)

## 2012-11-12 LAB — SURGICAL PCR SCREEN: MRSA, PCR: NEGATIVE

## 2012-11-12 MED ORDER — CEFAZOLIN SODIUM-DEXTROSE 2-3 GM-% IV SOLR
2.0000 g | INTRAVENOUS | Status: AC
  Administered 2012-11-13: 2 g via INTRAVENOUS
  Filled 2012-11-12: qty 50

## 2012-11-12 NOTE — Progress Notes (Signed)
Patient will get to Short Stay tomorrow as fast as she can...she has childcare issues that need her attention first!!!  DA

## 2012-11-12 NOTE — Pre-Procedure Instructions (Signed)
Laura Calderon  11/12/2012   Your procedure is scheduled on:  Tuesday, May 13th   Report to [facility] at 7:00 AM.             (Come through Entrance "A"--follow hallway to [facility]- take those to the 3rd Floor)   Call this number if you have problems the morning of surgery: [phone]   Remember:   Do not eat food or drink liquids after midnight tonight.   Take these medicines the morning of surgery with A SIP OF WATER: Hydrocodone              Stop taking Naproxen   Do not wear jewelry, make-up or nail polish.  Do not wear lotions, powders, or perfumes. You may NOT wear deodorant.  Do not shave underarms & legs 48 hours prior to surgery.   Do not bring valuables to the hospital.   Contacts, dentures or bridgework may not be worn into surgery.  Leave suitcase in the car. After surgery it may be brought to your room.   For patients admitted to the hospital, checkout time is 11:00 AM the day of discharge.   Patients discharged the day of surgery will not be allowed to drive home, and will need someone to stay              With you for the first 24 hrs after surgery.   Name and phone number of your driver:    Special Instructions: Shower using CHG 2 nights before surgery and the night before surgery.  If you shower the day of surgery use CHG.  Use special wash - you have one bottle of CHG for all showers.  You should use approximately 1/3 of the bottle for each shower.   Please read over the following fact sheets that you were given: Pain Booklet, MRSA Information and Surgical Site Infection Prevention

## 2012-11-13 ENCOUNTER — Ambulatory Visit (HOSPITAL_COMMUNITY): Payer: PRIVATE HEALTH INSURANCE | Admitting: Anesthesiology

## 2012-11-13 ENCOUNTER — Ambulatory Visit (HOSPITAL_COMMUNITY): Payer: PRIVATE HEALTH INSURANCE

## 2012-11-13 ENCOUNTER — Encounter (HOSPITAL_COMMUNITY): Payer: Self-pay | Admitting: *Deleted

## 2012-11-13 ENCOUNTER — Encounter (HOSPITAL_COMMUNITY)
Admission: RE | Disposition: A | Discharge status: Home / Self Care | Payer: Self-pay | Source: Ambulatory Visit | Attending: General Surgery

## 2012-11-13 ENCOUNTER — Encounter (HOSPITAL_COMMUNITY): Payer: Self-pay | Admitting: Anesthesiology

## 2012-11-13 ENCOUNTER — Ambulatory Visit (HOSPITAL_COMMUNITY)
Admission: RE | Admit: 2012-11-13 | Discharge: 2012-11-13 | Disposition: A | Discharge status: Home / Self Care | Payer: PRIVATE HEALTH INSURANCE | Source: Ambulatory Visit | Attending: General Surgery | Admitting: General Surgery

## 2012-11-13 DIAGNOSIS — R51 Headache: Secondary | ICD-10-CM | POA: Insufficient documentation

## 2012-11-13 DIAGNOSIS — K8020 Calculus of gallbladder without cholecystitis without obstruction: Secondary | ICD-10-CM

## 2012-11-13 DIAGNOSIS — K8010 Calculus of gallbladder with chronic cholecystitis without obstruction: Secondary | ICD-10-CM | POA: Insufficient documentation

## 2012-11-13 DIAGNOSIS — G709 Myoneural disorder, unspecified: Secondary | ICD-10-CM | POA: Insufficient documentation

## 2012-11-13 DIAGNOSIS — M51379 Other intervertebral disc degeneration, lumbosacral region without mention of lumbar back pain or lower extremity pain: Secondary | ICD-10-CM | POA: Insufficient documentation

## 2012-11-13 DIAGNOSIS — Z8611 Personal history of tuberculosis: Secondary | ICD-10-CM | POA: Insufficient documentation

## 2012-11-13 DIAGNOSIS — M5137 Other intervertebral disc degeneration, lumbosacral region: Secondary | ICD-10-CM | POA: Insufficient documentation

## 2012-11-13 DIAGNOSIS — E162 Hypoglycemia, unspecified: Secondary | ICD-10-CM | POA: Insufficient documentation

## 2012-11-13 DIAGNOSIS — D649 Anemia, unspecified: Secondary | ICD-10-CM | POA: Insufficient documentation

## 2012-11-13 DIAGNOSIS — Z87891 Personal history of nicotine dependence: Secondary | ICD-10-CM | POA: Insufficient documentation

## 2012-11-13 HISTORY — PX: CHOLECYSTECTOMY: SHX55

## 2012-11-13 SURGERY — LAPAROSCOPIC CHOLECYSTECTOMY WITH INTRAOPERATIVE CHOLANGIOGRAM
Anesthesia: General | Site: Abdomen | Wound class: Contaminated

## 2012-11-13 MED ORDER — BUPIVACAINE HCL (PF) 0.25 % IJ SOLN
INTRAMUSCULAR | Status: AC
  Filled 2012-11-13: qty 30

## 2012-11-13 MED ORDER — HYDROCODONE-ACETAMINOPHEN 5-325 MG PO TABS: 1.0000 | ORAL_TABLET | ORAL | Status: DC | PRN

## 2012-11-13 MED ORDER — DEXAMETHASONE SODIUM PHOSPHATE 10 MG/ML IJ SOLN
INTRAMUSCULAR | Status: DC | PRN
  Administered 2012-11-13: 4 mg via INTRAVENOUS

## 2012-11-13 MED ORDER — LIDOCAINE HCL (CARDIAC) 20 MG/ML IV SOLN
INTRAVENOUS | Status: DC | PRN
  Administered 2012-11-13: 100 mg via INTRAVENOUS

## 2012-11-13 MED ORDER — SODIUM CHLORIDE 0.9 % IV SOLN
INTRAVENOUS | Status: DC | PRN
  Administered 2012-11-13: 09:00:00

## 2012-11-13 MED ORDER — LIDOCAINE-EPINEPHRINE (PF) 1 %-1:200000 IJ SOLN
INTRAMUSCULAR | Status: AC
  Filled 2012-11-13: qty 10

## 2012-11-13 MED ORDER — METOCLOPRAMIDE HCL 5 MG/ML IJ SOLN
INTRAMUSCULAR | Status: AC
  Filled 2012-11-13: qty 2

## 2012-11-13 MED ORDER — ONDANSETRON HCL 4 MG/2ML IJ SOLN
INTRAMUSCULAR | Status: DC | PRN
  Administered 2012-11-13: 4 mg via INTRAVENOUS

## 2012-11-13 MED ORDER — OXYCODONE HCL 5 MG PO TABS
5.0000 mg | ORAL_TABLET | Freq: Once | ORAL | Status: AC | PRN
  Administered 2012-11-13: 5 mg via ORAL
  Filled 2012-11-13: qty 1

## 2012-11-13 MED ORDER — ROCURONIUM BROMIDE 100 MG/10ML IV SOLN
INTRAVENOUS | Status: DC | PRN
  Administered 2012-11-13: 50 mg via INTRAVENOUS

## 2012-11-13 MED ORDER — LACTATED RINGERS IV SOLN
INTRAVENOUS | Status: DC | PRN
  Administered 2012-11-13 (×2): via INTRAVENOUS

## 2012-11-13 MED ORDER — HYDROMORPHONE HCL PF 1 MG/ML IJ SOLN
INTRAMUSCULAR | Status: AC
  Filled 2012-11-13: qty 1

## 2012-11-13 MED ORDER — LACTATED RINGERS IV SOLN
INTRAVENOUS | Status: DC
  Administered 2012-11-13: 08:00:00 via INTRAVENOUS

## 2012-11-13 MED ORDER — OXYCODONE HCL 5 MG/5ML PO SOLN: 5.0000 mg | Freq: Once | ORAL | Status: AC | PRN

## 2012-11-13 MED ORDER — METOCLOPRAMIDE HCL 5 MG/ML IJ SOLN
10.0000 mg | Freq: Once | INTRAMUSCULAR | Status: AC | PRN
  Administered 2012-11-13: 10 mg via INTRAVENOUS

## 2012-11-13 MED ORDER — LIDOCAINE-EPINEPHRINE (PF) 1 %-1:200000 IJ SOLN
INTRAMUSCULAR | Status: DC | PRN
  Administered 2012-11-13: 09:00:00

## 2012-11-13 MED ORDER — PHENYLEPHRINE HCL 10 MG/ML IJ SOLN
INTRAMUSCULAR | Status: DC | PRN
  Administered 2012-11-13: 80 ug via INTRAVENOUS

## 2012-11-13 MED ORDER — HEPARIN SODIUM (PORCINE) 5000 UNIT/ML IJ SOLN
5000.0000 [IU] | Freq: Once | INTRAMUSCULAR | Status: AC
  Administered 2012-11-13: 5000 [IU] via SUBCUTANEOUS
  Filled 2012-11-13: qty 1

## 2012-11-13 MED ORDER — NEOSTIGMINE METHYLSULFATE 1 MG/ML IJ SOLN
INTRAMUSCULAR | Status: DC | PRN
  Administered 2012-11-13: 3 mg via INTRAVENOUS

## 2012-11-13 MED ORDER — MIDAZOLAM HCL 5 MG/5ML IJ SOLN
INTRAMUSCULAR | Status: DC | PRN
  Administered 2012-11-13: 2 mg via INTRAVENOUS

## 2012-11-13 MED ORDER — ARTIFICIAL TEARS OP OINT
TOPICAL_OINTMENT | OPHTHALMIC | Status: DC | PRN
  Administered 2012-11-13: 1 via OPHTHALMIC

## 2012-11-13 MED ORDER — SODIUM CHLORIDE 0.9 % IR SOLN
Status: DC | PRN
  Administered 2012-11-13: 1000 mL

## 2012-11-13 MED ORDER — GLYCOPYRROLATE 0.2 MG/ML IJ SOLN
INTRAMUSCULAR | Status: DC | PRN
  Administered 2012-11-13: 0.4 mg via INTRAVENOUS

## 2012-11-13 MED ORDER — FENTANYL CITRATE 0.05 MG/ML IJ SOLN
INTRAMUSCULAR | Status: DC | PRN
  Administered 2012-11-13 (×5): 50 ug via INTRAVENOUS

## 2012-11-13 MED ORDER — HYDROMORPHONE HCL PF 1 MG/ML IJ SOLN
0.2500 mg | INTRAMUSCULAR | Status: DC | PRN
  Administered 2012-11-13: 0.25 mg via INTRAVENOUS

## 2012-11-13 MED ORDER — PROPOFOL 10 MG/ML IV BOLUS
INTRAVENOUS | Status: DC | PRN
  Administered 2012-11-13: 200 mg via INTRAVENOUS

## 2012-11-13 SURGICAL SUPPLY — 45 items
ADH SKN CLS APL DERMABOND .7 (GAUZE/BANDAGES/DRESSINGS)
APPLIER CLIP ROT 10 11.4 M/L (STAPLE) ×2
APR CLP MED LRG 11.4X10 (STAPLE) ×1
BAG SPEC RTRVL LRG 6X4 10 (ENDOMECHANICALS) ×1
BLADE SURG ROTATE 9660 (MISCELLANEOUS) IMPLANT
CANISTER SUCTION 2500CC (MISCELLANEOUS) ×2 IMPLANT
CHLORAPREP W/TINT 26ML (MISCELLANEOUS) ×3 IMPLANT
CLIP APPLIE ROT 10 11.4 M/L (STAPLE) ×1 IMPLANT
CLOTH BEACON ORANGE TIMEOUT ST (SAFETY) ×2 IMPLANT
COVER SURGICAL LIGHT HANDLE (MISCELLANEOUS) ×2 IMPLANT
DECANTER SPIKE VIAL GLASS SM (MISCELLANEOUS) ×2 IMPLANT
DERMABOND ADVANCED (GAUZE/BANDAGES/DRESSINGS)
DERMABOND ADVANCED .7 DNX12 (GAUZE/BANDAGES/DRESSINGS) ×1 IMPLANT
DRAPE C-ARM 42X72 X-RAY (DRAPES) ×2 IMPLANT
ELECT CAUTERY BLADE 6.4 (BLADE) ×2 IMPLANT
ELECT REM PT RETURN 9FT ADLT (ELECTROSURGICAL) ×2
ELECTRODE REM PT RTRN 9FT ADLT (ELECTROSURGICAL) ×1 IMPLANT
GLOVE BIO SURGEON STRL SZ7.5 (GLOVE) ×1 IMPLANT
GLOVE BIOGEL PI IND STRL 6 (GLOVE) IMPLANT
GLOVE BIOGEL PI IND STRL 8 (GLOVE) IMPLANT
GLOVE BIOGEL PI INDICATOR 6 (GLOVE) ×1
GLOVE BIOGEL PI INDICATOR 8 (GLOVE) ×2
GLOVE SURG SS PI 7.5 STRL IVOR (GLOVE) ×4 IMPLANT
GOWN STRL NON-REIN LRG LVL3 (GOWN DISPOSABLE) ×5 IMPLANT
GOWN STRL REIN XL XLG (GOWN DISPOSABLE) ×2 IMPLANT
KIT BASIN OR (CUSTOM PROCEDURE TRAY) ×2 IMPLANT
KIT ROOM TURNOVER OR (KITS) ×2 IMPLANT
NS IRRIG 1000ML POUR BTL (IV SOLUTION) ×2 IMPLANT
PAD ARMBOARD 7.5X6 YLW CONV (MISCELLANEOUS) ×2 IMPLANT
PENCIL BUTTON HOLSTER BLD 10FT (ELECTRODE) ×2 IMPLANT
POUCH SPECIMEN RETRIEVAL 10MM (ENDOMECHANICALS) ×2 IMPLANT
SCISSORS LAP 5X35 DISP (ENDOMECHANICALS) IMPLANT
SET CHOLANGIOGRAPH 5 50 .035 (SET/KITS/TRAYS/PACK) ×2 IMPLANT
SET IRRIG TUBING LAPAROSCOPIC (IRRIGATION / IRRIGATOR) ×2 IMPLANT
SLEEVE ENDOPATH XCEL 5M (ENDOMECHANICALS) ×2 IMPLANT
SPECIMEN JAR SMALL (MISCELLANEOUS) ×2 IMPLANT
SUT MNCRL AB 4-0 PS2 18 (SUTURE) ×3 IMPLANT
SUT VICRYL 0 UR6 27IN ABS (SUTURE) ×2 IMPLANT
TOWEL OR 17X24 6PK STRL BLUE (TOWEL DISPOSABLE) ×2 IMPLANT
TOWEL OR 17X26 10 PK STRL BLUE (TOWEL DISPOSABLE) ×2 IMPLANT
TRAY FOLEY CATH 14FR (SET/KITS/TRAYS/PACK) IMPLANT
TRAY LAPAROSCOPIC (CUSTOM PROCEDURE TRAY) ×2 IMPLANT
TROCAR BALLN 12MMX100 BLUNT (TROCAR) ×2 IMPLANT
TROCAR XCEL NON-BLD 11X100MML (ENDOMECHANICALS) ×2 IMPLANT
TROCAR XCEL NON-BLD 5MMX100MML (ENDOMECHANICALS) ×2 IMPLANT

## 2012-11-13 NOTE — Brief Op Note (Signed)
11/13/2012  10:33 AM  PATIENT:  Laura Calderon  32 y.o. female  PRE-OPERATIVE DIAGNOSIS:  gallstones  POST-OPERATIVE DIAGNOSIS:  gallstones  PROCEDURE:  Procedure(s): LAPAROSCOPIC CHOLECYSTECTOMY WITH INTRAOPERATIVE CHOLANGIOGRAM (N/A)  SURGEON:  Surgeon(s) and Role:    * Lodema Pilot, DO - Primary  PHYSICIAN ASSISTANT:   ASSISTANTS: none   ANESTHESIA:   general  EBL:  Total I/O In: 1400 [I.V.:1400] Out: -   BLOOD ADMINISTERED:none  DRAINS: none   LOCAL MEDICATIONS USED:  MARCAINE    and LIDOCAINE   SPECIMEN:  Source of Specimen:  gallbladder  DISPOSITION OF SPECIMEN:  PATHOLOGY  COUNTS:  YES  TOURNIQUET:  * No tourniquets in log *  DICTATION: .Other Dictation: Dictation Number [id number]  PLAN OF CARE: Discharge to home after PACU  PATIENT DISPOSITION:  PACU - hemodynamically stable.   Delay start of Pharmacological VTE agent (>24hrs) due to surgical blood loss or risk of bleeding: no

## 2012-11-13 NOTE — H&P (View-Only) (Signed)
Patient ID: Laura Calderon, female   DOB: Nov 30, 1980, 32 y.o.   MRN: [id number]  Chief Complaint  Patient presents with  . New Evaluation    eval chole    HPI Laura Calderon is a 32 y.o. female.  This patient was referred by Dr. Blinda Leatherwood from the emergency room. She was seen last night after her second episode of epigastric abdominal pain the last 3 months. She said that yesterday after eating lunch she had severe epigastric cramping which radiated to the right shoulder and lasted about 20 minutes and then spontaneously resolved. She had some associated shortness of breath and chest tightness as well.  She went to the emergency room by EMS and by the time that she was in the emergency room her dizziness and shortness of breath and abdominal pain had improved. She had a dorsalis demonstrated cholelithiasis without evidence of acute cholecystitis. She denied any fevers or chills or nausea with this episode although she did have a similar episode about 3 months prior which left a little bit longer and she did have some associated nausea with this. She does take NSAIDs about 1 time per week for degenerative disc disease but she denies any heartburn symptoms. Also, she inquired about weight loss surgery because she was just recently diagnosed with "prediabetes". She has a BMI of 37.7 as well as degenerative disc disease and has a strong family history of diabetes. She says that she has tried several diets and really been unsuccessful in losing weight  HPI  Past Medical History  Diagnosis Date  . Hypoglycemia   . Headache     hx of, none recently  . Urinary tract infection   . Abnormal Pap smear     colposcopy- normal  . Degenerative disc disease     L4 AND L5  . Hx of colposcopy with cervical biopsy   . Tuberculosis 1999    Past Surgical History  Procedure Laterality Date  . No past surgeries    . Cesarean section  10/08/2011    Procedure: CESAREAN SECTION;  Surgeon: Laura Morales, MD;   Location: [facility];  Service: Gynecology;  Laterality: N/A;  Primary Cesarean Section Delivery Boy @ [phone], Apgars 9/9  . Root canal    . Wisdom tooth extraction      Family History  Problem Relation Age of Onset  . Anesthesia problems Neg Hx   . Diabetes Mother   . Anemia Mother   . Sleep apnea Father     died from age 48  . Hypertension Father   . Cancer Maternal Aunt     breast  . Diabetes Maternal Aunt   . Stroke Maternal Aunt   . Lupus Maternal Aunt   . Cancer Maternal Grandmother     breast  . Anemia Maternal Grandmother   . Diabetes Maternal Grandmother   . Stroke Maternal Grandfather     during surgery  . Hypertension Paternal Grandmother   . Diabetes Paternal Grandmother   . Multiple sclerosis Paternal Aunt     Social History History  Substance Use Topics  . Smoking status: Former Smoker -- 2 years  . Smokeless tobacco: Never Used     Comment: quit 2005  . Alcohol Use: No     Comment: no since pregnancy    No Known Allergies  Current Outpatient Prescriptions  Medication Sig Dispense Refill  . HYDROcodone-acetaminophen (NORCO/VICODIN) 5-325 MG per tablet Take 2 tablets by mouth every 4 (four) hours as needed  for pain.  10 tablet  0  . Multiple Vitamin (MULTIVITAMIN WITH MINERALS) TABS Take 1 tablet by mouth daily.       No current facility-administered medications for this visit.    Review of Systems Review of Systems All other review of systems negative or noncontributory except as stated in the HPI  Blood pressure 132/88, pulse 68, temperature 97.9 F (36.6 C), temperature source Temporal, resp. rate 16, height 5' 10.5" (1.791 m), weight 262 lb 12.8 oz (119.205 kg).  Physical Exam Physical Exam Physical Exam  Nursing note and vitals reviewed. Constitutional: She is oriented to person, place, and time. She appears well-developed and well-nourished. No distress.  HENT:  Head: Normocephalic and atraumatic.  Mouth/Throat: No oropharyngeal exudate.   Eyes: Conjunctivae and EOM are normal. Pupils are equal, round, and reactive to light. Right eye exhibits no discharge. Left eye exhibits no discharge. No scleral icterus.  Neck: Normal range of motion. Neck supple. No tracheal deviation present.  Cardiovascular: Normal rate, regular rhythm, normal heart sounds and intact distal pulses.   Pulmonary/Chest: Effort normal and breath sounds normal. No stridor. No respiratory distress. She has no wheezes.  Abdominal: Soft. Bowel sounds are normal. She exhibits no distension and no mass. There is no tenderness. There is no rebound and no guarding.  Musculoskeletal: Normal range of motion. She exhibits no edema and no tenderness.  Neurological: She is alert and oriented to person, place, and time.  Skin: Skin is warm and dry. No rash noted. She is not diaphoretic. No erythema. No pallor.  Psychiatric: She has a normal mood and affect. Her behavior is normal. Judgment and thought content normal.    Data Reviewed [place], labs  Assessment    Systematic cholelithiasis I think that her symptoms sound fairly typical for symptomatically lithiasis. Given her ultrasound findings and her history I have offered her cholecystectomy for possible relief and prevention of future episodes.  We discussed the options of continued observation of versus surgery as well as the risks and benefits of the procedure. The risks of infection, bleeding, pain, persistent symptoms, scarring, injury to bowel or bile ducts, retained stone, diarrhea, need for additional procedures, and need for open surgery discussed with the patient. She would like to go ahead and schedule elective cholecystectomy. Morbid obesity with obesity related comorbidities of prediabetes, degenerative disc disease I think it is reasonable to look into possible weight loss surgery for her especially given her new diagnosis of "prediabetes". I briefly discussed the procedures and recommended that she attended one  of our information sessions to learn more about the surgical options were obesity and if this is something that she is interested in that it be happy to see her back to discuss this further      Plan    We will go ahead and set her up for laparoscopic cholecystectomy        Nilesh Stegall DAVID 11/02/2012, 10:49 AM

## 2012-11-13 NOTE — Interval H&P Note (Signed)
History and Physical Interval Note:  11/13/2012 8:24 AM  Laura Calderon  has presented today for surgery, with the diagnosis of gallstones  The various methods of treatment have been discussed with the patient and family. After consideration of risks, benefits and other options for treatment, the patient has consented to  Procedure(s): LAPAROSCOPIC CHOLECYSTECTOMY WITH INTRAOPERATIVE CHOLANGIOGRAM (N/A) as a surgical intervention .  The patient's history has been reviewed, patient examined, no change in status, stable for surgery.  I have reviewed the patient's chart and labs.  Questions were answered to the patient's satisfaction.  Pt seen in the preop area.  Risks of the procedure again discussed with the patient.  The risks of infection, bleeding, pain, persistent symptoms, scarring, injury to bowel or bile ducts, retained stone, diarrhea, need for additional procedures, and need for open surgery discussed with the patient.  She expressed understanding and desires to proceed with lap chole, IOC, possible open.    Lodema Pilot DAVID

## 2012-11-13 NOTE — Transfer of Care (Signed)
Immediate Anesthesia Transfer of Care Note  Patient: Laura Calderon  Procedure(s) Performed: Procedure(s): LAPAROSCOPIC CHOLECYSTECTOMY WITH INTRAOPERATIVE CHOLANGIOGRAM (N/A)  Patient Location: PACU  Anesthesia Type:General  Level of Consciousness: awake, alert , oriented and sedated  Airway & Oxygen Therapy: Patient Spontanous Breathing and Patient connected to nasal cannula oxygen  Post-op Assessment: Report given to PACU RN, Post -op Vital signs reviewed and stable and Patient moving all extremities  Post vital signs: Reviewed and stable  Complications: No apparent anesthesia complications

## 2012-11-13 NOTE — Preoperative (Signed)
Beta Blockers   Reason not to administer Beta Blockers:Not Applicable 

## 2012-11-13 NOTE — Progress Notes (Signed)
Waiting on short stay rn to call back

## 2012-11-13 NOTE — Anesthesia Postprocedure Evaluation (Signed)
Anesthesia Post Note  Patient: Laura Calderon  Procedure(s) Performed: Procedure(s) (LRB): LAPAROSCOPIC CHOLECYSTECTOMY WITH INTRAOPERATIVE CHOLANGIOGRAM (N/A)  Anesthesia type: General  Patient location: PACU  Post pain: Pain level controlled  Post assessment: Patient's Cardiovascular Status Stable  Last Vitals:  Filed Vitals:   11/13/12 1111  BP: 115/76  Pulse: 59  Temp:   Resp: 18    Post vital signs: Reviewed and stable  Level of consciousness: alert  Complications: No apparent anesthesia complications

## 2012-11-13 NOTE — Anesthesia Preprocedure Evaluation (Addendum)
Anesthesia Evaluation  Patient identified by MRN, date of birth, ID band Patient awake    Reviewed: Allergy & Precautions, H&P , NPO status , Patient's Chart, lab work & pertinent test results, reviewed documented beta blocker date and time   Airway Mallampati: II TM Distance: >3 FB Neck ROM: full    Dental   Pulmonary neg pulmonary ROS,  breath sounds clear to auscultation        Cardiovascular negative cardio ROS  Rhythm:regular     Neuro/Psych  Headaches,  Neuromuscular disease negative psych ROS   GI/Hepatic negative GI ROS, Neg liver ROS,   Endo/Other  Morbid obesity  Renal/GU negative Renal ROS  negative genitourinary   Musculoskeletal   Abdominal   Peds  Hematology  (+) anemia ,   Anesthesia Other Findings See surgeon's H&P   Reproductive/Obstetrics negative OB ROS                           Anesthesia Physical Anesthesia Plan  ASA: II  Anesthesia Plan: General   Post-op Pain Management:    Induction: Intravenous  Airway Management Planned: Oral ETT  Additional Equipment:   Intra-op Plan:   Post-operative Plan: Extubation in OR  Informed Consent: I have reviewed the patients History and Physical, chart, labs and discussed the procedure including the risks, benefits and alternatives for the proposed anesthesia with the patient or authorized representative who has indicated his/her understanding and acceptance.   Dental Advisory Given  Plan Discussed with: CRNA and Surgeon  Anesthesia Plan Comments:        Anesthesia Quick Evaluation

## 2012-11-14 ENCOUNTER — Telehealth (INDEPENDENT_AMBULATORY_CARE_PROVIDER_SITE_OTHER): Payer: Self-pay | Admitting: General Surgery

## 2012-11-14 ENCOUNTER — Encounter (HOSPITAL_COMMUNITY): Payer: Self-pay | Admitting: General Surgery

## 2012-11-14 NOTE — Op Note (Signed)
Laura Calderon, Laura Calderon              ACCOUNT NO.:  [id number]  MEDICAL RECORD NO.:  [id number]  LOCATION:  [facility]                         FACILITY:  [facility]  PHYSICIAN:  Lodema Pilot, MD       DATE OF BIRTH:  [birth year]  DATE OF PROCEDURE:  11/13/2012 DATE OF DISCHARGE:  11/13/2012                              OPERATIVE REPORT   PROCEDURE:  Laparoscopic cholecystectomy with intraoperative cholangiogram.  PREOPERATIVE DIAGNOSIS:  Symptomatic cholelithiasis.  POSTOPERATIVE DIAGNOSIS:  Symptomatic cholelithiasis.  SURGEON:  Lodema Pilot, MD  ASSISTANT:  None.  ANESTHESIA:  General endotracheal tube anesthesia with 20 mL of 1% lidocaine with epinephrine and 0.25% Marcaine in 50:50 mixture.  FLUIDS:  1400 mL crystalloid.  ESTIMATED BLOOD LOSS:  Minimal.  DRAINS:  None.  SPECIMENS:  Gallbladder and contents sent to Pathology for permanent sectioning.  COMPLICATIONS:  None apparent.  INDICATION FOR PROCEDURE:  Laura Calderon is a 32 year old female with an episode of symptomatic cholelithiasis, which led to emergency room evaluation.  She was noted to have gallstones and referred for surgical evaluation.  FINDINGS:  Normal-appearing anatomy, multiple small gallstones, normal cholangiogram.  OPERATIVE DETAILS:  Laura Calderon was seen and evaluated in the preoperative area and risks and benefits of procedure were discussed in lay terms.  Informed consent was obtained.  She was taken to the operating room, placed on the table in supine position, and general endotracheal tube anesthesia was obtained.  Prophylactic antibiotics were given and her abdomen was prepped and draped in the standard surgical fashion.  Procedure time-out was performed with all operative team members to confirm proper patient, procedure, and a supraumbilical midline incision was made in the skin and dissection carried down to subcutaneous tissue using blunt dissection.  The abdominal wall fascia was elevated and  sharply incised and peritoneum entered bluntly.  A 12- mm balloon port was placed at the umbilicus and pneumoperitoneum was obtained.  Laparoscope was introduced and the underlying bowel contents were investigated.  There was no evidence of bleeding or bowel injury. Then, two 5-mm right upper quadrant ports and an epigastric 11-mm port was placed under direct visualization and the gallbladder was retracted cephalad.  She had a floppy gallbladder and normal-appearing anatomy. The peritoneum was taken down off the gallbladder using blunt dissection.  The cystic artery was skeletonized and 3 hemoclips were placed on the artery.  This was seen coursing into the gallbladder, but it was not divided this time.  Cystic duct was skeletonized and a critical view safety was obtained, visualizing a single cystic duct entering the gallbladder.  The liver parenchyma was visualized through the triangle of Calot and a single cystic artery coursing onto the gallbladder.  A cholangiogram catheter was placed into the abdomen through the abdominal wall and a small cystic ductotomy was made and the cholangiogram catheter was placed in the cystic duct and cholangiogram was performed which demonstrated free flow of bile into the duodenum and normal right and left hepatic ducts and normal cystic ducts.  It first appeared that there was possibly some air bubbles in the common hepatic duct.  I placed her in a head-down position and repeated the cholangiogram and the second cholangiogram  appeared much better and again there is free flow of bile into duodenum.  The cholangiogram catheter was removed and clips were placed on the cystic duct stump and the cystic duct was divided and the previously clipped artery was divided and the gallbladder was removed from the gallbladder fossa using Bovie electrocautery and the gallbladder was not entered during the dissection.  There were no inflammatory changes in the dissection  of the gallbladder, actually it seemed very simple as she seemed to have a little mesentery to the gallbladder as well.  The gallbladder was completely removed and placed in EndoCatch bag and removed from the umbilical trocar site.  The trocar was replaced and the right upper quadrant was irrigated with sterile saline solution.  Irrigation returned clear.  The gallbladder fossa was inspected for hemostasis which was noted to be adequate.  There was no evidence of bleeding or any evidence of bowel injury.  The periumbilical bowel was investigated again, appeared normal.  She did have omental adhesion to her midline just inferior to the umbilicus and this was left in place.  I removed her umbilical trocar and closed the fascia in open fashion. Sutures were secured and the abdomen was re-insufflated with carbon dioxide gas through the epigastric port.  Abdominal closure was noted to be adequate without any evidence of bowel injury.  The underlying bowel was inspected and again noted to be adequate.  The right upper quadrant was hemostatic and again no evidence of bile or bleeding or bowel injury.  Final right upper quadrant trocar was removed under direct visualization and the abdominal wall was noted to be hemostatic.  The skin was washed and dried, and the incisions were injected with 20 mL of 1% lidocaine with epinephrine and 0.25% Marcaine in a 50:50 mixture. Skin edges were approximated with 4-0 Monocryl subcuticular suture. Skin was washed and dried and Dermabond was applied.  All sponge, needle, and instrument counts were correct at the end of the case.  The patient tolerated the procedure well without apparent complications. Per the patient's request, I took picture of her gallbladder on the back table and opened it up, took picture of her gallstones and picture was given to the patient again per her request.  The specimen was sent to Pathology.           ______________________________ Lodema Pilot, MD     BL/MEDQ  D:  11/13/2012  T:  11/14/2012  Job:  [id number]

## 2012-11-14 NOTE — Telephone Encounter (Signed)
Spoke with patient stating her pain medication hydrocodone was not working very good  She was only taking 1 as directed  She did try taking 2 last night and it did help but made her sleepy ibuprofen advised her to take pain meds as directed and add ibuprofen every 3-4 hours in between for break through ,

## 2014-01-23 ENCOUNTER — Telehealth: Payer: Self-pay | Admitting: Cardiovascular Disease

## 2014-01-23 NOTE — Telephone Encounter (Signed)
5 pages of records [facility]: Given to [place] for patient's appointment with Dr. Allyson SabalBerry on 7.29.15 at 3:00:djc

## 2014-01-29 ENCOUNTER — Encounter: Payer: Self-pay | Admitting: Cardiovascular Disease

## 2014-01-29 ENCOUNTER — Ambulatory Visit (INDEPENDENT_AMBULATORY_CARE_PROVIDER_SITE_OTHER): Payer: PRIVATE HEALTH INSURANCE | Admitting: Cardiovascular Disease

## 2014-01-29 VITALS — BP 130/90 | HR 79 | Ht 70.0 in | Wt 260.7 lb

## 2014-01-29 DIAGNOSIS — R0989 Other specified symptoms and signs involving the circulatory and respiratory systems: Secondary | ICD-10-CM

## 2014-01-29 DIAGNOSIS — R0609 Other forms of dyspnea: Secondary | ICD-10-CM

## 2014-01-29 DIAGNOSIS — R0602 Shortness of breath: Secondary | ICD-10-CM

## 2014-01-29 DIAGNOSIS — R0601 Orthopnea: Secondary | ICD-10-CM

## 2014-01-29 NOTE — Progress Notes (Signed)
01/29/2014 Laura Hancockiffany D Luis   07/10/1980  [id number]  Primary Physician Gwynneth AlimentSANDERS,ROBYN N, MD Primary Cardiologist: Runell GessJonathan J. Keyle Doby MD Roseanne RenoFACP,FACC,FAHA, FSCAI   HPI:  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 [place]. Her primary care doctor is Dorothyann PengRobyn Sanders at her OB/GYN is Dr. Normand Sloopillard. Popping in. She has no cardiac risk factors other than a father who died in his sleep at age 33 of unclear reasons. Since her pregnancy to use ago she is noticed orthopnea. She gasps and get short of breath on the lower back, better when lying on her side. She also gets somewhat dyspneic on exertion with elevated heart rate with minimal exertion.   Current Outpatient Prescriptions  Medication Sig Dispense Refill  . norgestimate-ethinyl estradiol (ORTHO-CYCLEN,SPRINTEC,PREVIFEM) 0.25-35 MG-MCG tablet Take 1 tablet by mouth daily.       No current facility-administered medications for this visit.    No Known Allergies  History   Social History  . Marital Status: Married    Spouse Name: N/A    Number of Children: N/A  . Years of Education: N/A   Occupational History  . Not on file.   Social History Main Topics  . Smoking status: Former Smoker -- 2 years  . Smokeless tobacco: Former NeurosurgeonUser     Comment: quit 2005  . Alcohol Use: Yes     Comment: on occasion  . Drug Use: No  . Sexual Activity: Yes    Birth Control/ Protection: IUD     Comment: MIRENA   Other Topics Concern  . Not on file   Social History Narrative  . No narrative on file     Review of Systems: General: negative for chills, fever, night sweats or weight changes.  Cardiovascular: negative for chest pain, dyspnea on exertion, edema, orthopnea, palpitations, paroxysmal nocturnal dyspnea or shortness of breath Dermatological: negative for rash Respiratory: negative for  cough or wheezing Urologic: negative for hematuria Abdominal: negative for nausea, vomiting, diarrhea, bright red blood per rectum, melena, or hematemesis Neurologic: negative for visual changes, syncope, or dizziness All other systems reviewed and are otherwise negative except as noted above.    Blood pressure 130/90, pulse 79, height 5\' 10"  (1.778 m), weight 260 lb 11.2 oz (118.253 kg).  General appearance: alert and no distress Neck: no adenopathy, no carotid bruit, no JVD, supple, symmetrical, trachea midline and thyroid not enlarged, symmetric, no tenderness/mass/nodules Lungs: clear to auscultation bilaterally Heart: regular rate and rhythm, S1, S2 normal, no murmur, click, rub or gallop Extremities: extremities normal, atraumatic, no cyanosis or edema and 2+ pedal pulses bilaterally  EKG normal sinus rhythm at 79 without ST or T wave changes  ASSESSMENT AND PLAN:   Orthopnea The patient has a history of orthopnea since her pregnancy 2 years ago. She is mildly overweight. She does not smoke. She really has no cardiovascular risk factors. She says that she feels short of breath and gasping she lies flat on her back, better when she lies on her side. She also says that her heart rate goes up higher than she would expect with minimal exertion. I suspect she is deconditioned. She admits to not performing any exercise at all. I'm going to get a 2-D echocardiogram to evaluate LV function as suggested weight loss. I will see her back in 6 months      Runell GessJonathan J. Annabella Elford  MD Nicholes Calamity, Franklyn Lor 01/29/2014 3:51 PM

## 2014-01-29 NOTE — Patient Instructions (Signed)
Your physician has requested that you have an echocardiogram. Echocardiography is a painless test that uses sound waves to create images of your heart. It provides your doctor with information about the size and shape of your heart and how well your heart's chambers and valves are working. This procedure takes approximately one hour. There are no restrictions for this procedure.   Your physician wants you to follow-up in 2 MONTHS DR BERRY.  You will receive a reminder letter in the mail two months in advance. If you don't receive a letter, please call our office to schedule the follow-up appointment.

## 2014-01-29 NOTE — Assessment & Plan Note (Signed)
The patient has a history of orthopnea since her pregnancy 2 years ago. She is mildly overweight. She does not smoke. She really has no cardiovascular risk factors. She says that she feels short of breath and gasping she lies flat on her back, better when she lies on her side. She also says that her heart rate goes up higher than she would expect with minimal exertion. I suspect she is deconditioned. She admits to not performing any exercise at all. I'm going to get a 2-D echocardiogram to evaluate LV function as suggested weight loss. I will see her back in 6 months

## 2014-02-06 ENCOUNTER — Ambulatory Visit (HOSPITAL_COMMUNITY)
Admission: RE | Admit: 2014-02-06 | Discharge: 2014-02-06 | Disposition: A | Discharge status: Home / Self Care | Payer: PRIVATE HEALTH INSURANCE | Source: Ambulatory Visit | Attending: Cardiovascular Disease | Admitting: Cardiovascular Disease

## 2014-02-06 DIAGNOSIS — R0609 Other forms of dyspnea: Secondary | ICD-10-CM | POA: Insufficient documentation

## 2014-02-06 DIAGNOSIS — I369 Nonrheumatic tricuspid valve disorder, unspecified: Secondary | ICD-10-CM

## 2014-02-06 DIAGNOSIS — R0601 Orthopnea: Secondary | ICD-10-CM

## 2014-02-06 DIAGNOSIS — R0602 Shortness of breath: Secondary | ICD-10-CM

## 2014-02-06 DIAGNOSIS — R0989 Other specified symptoms and signs involving the circulatory and respiratory systems: Principal | ICD-10-CM | POA: Insufficient documentation

## 2014-02-06 NOTE — Progress Notes (Signed)
2D Echo Performed 02/06/2014    Ray Gervasi, RCS  

## 2014-04-01 ENCOUNTER — Ambulatory Visit: Payer: PRIVATE HEALTH INSURANCE | Admitting: Cardiovascular Disease

## 2014-05-05 ENCOUNTER — Encounter: Payer: Self-pay | Admitting: Cardiovascular Disease

## 2014-06-19 IMAGING — CR DG CHEST 2V
2 series · 2 of 2 positions shown · non-contrast
Comparison: 12/27/2006

CLINICAL DATA: Chest pain. Shortness of breath.

CHEST - 2 VIEW

[w chest pa]
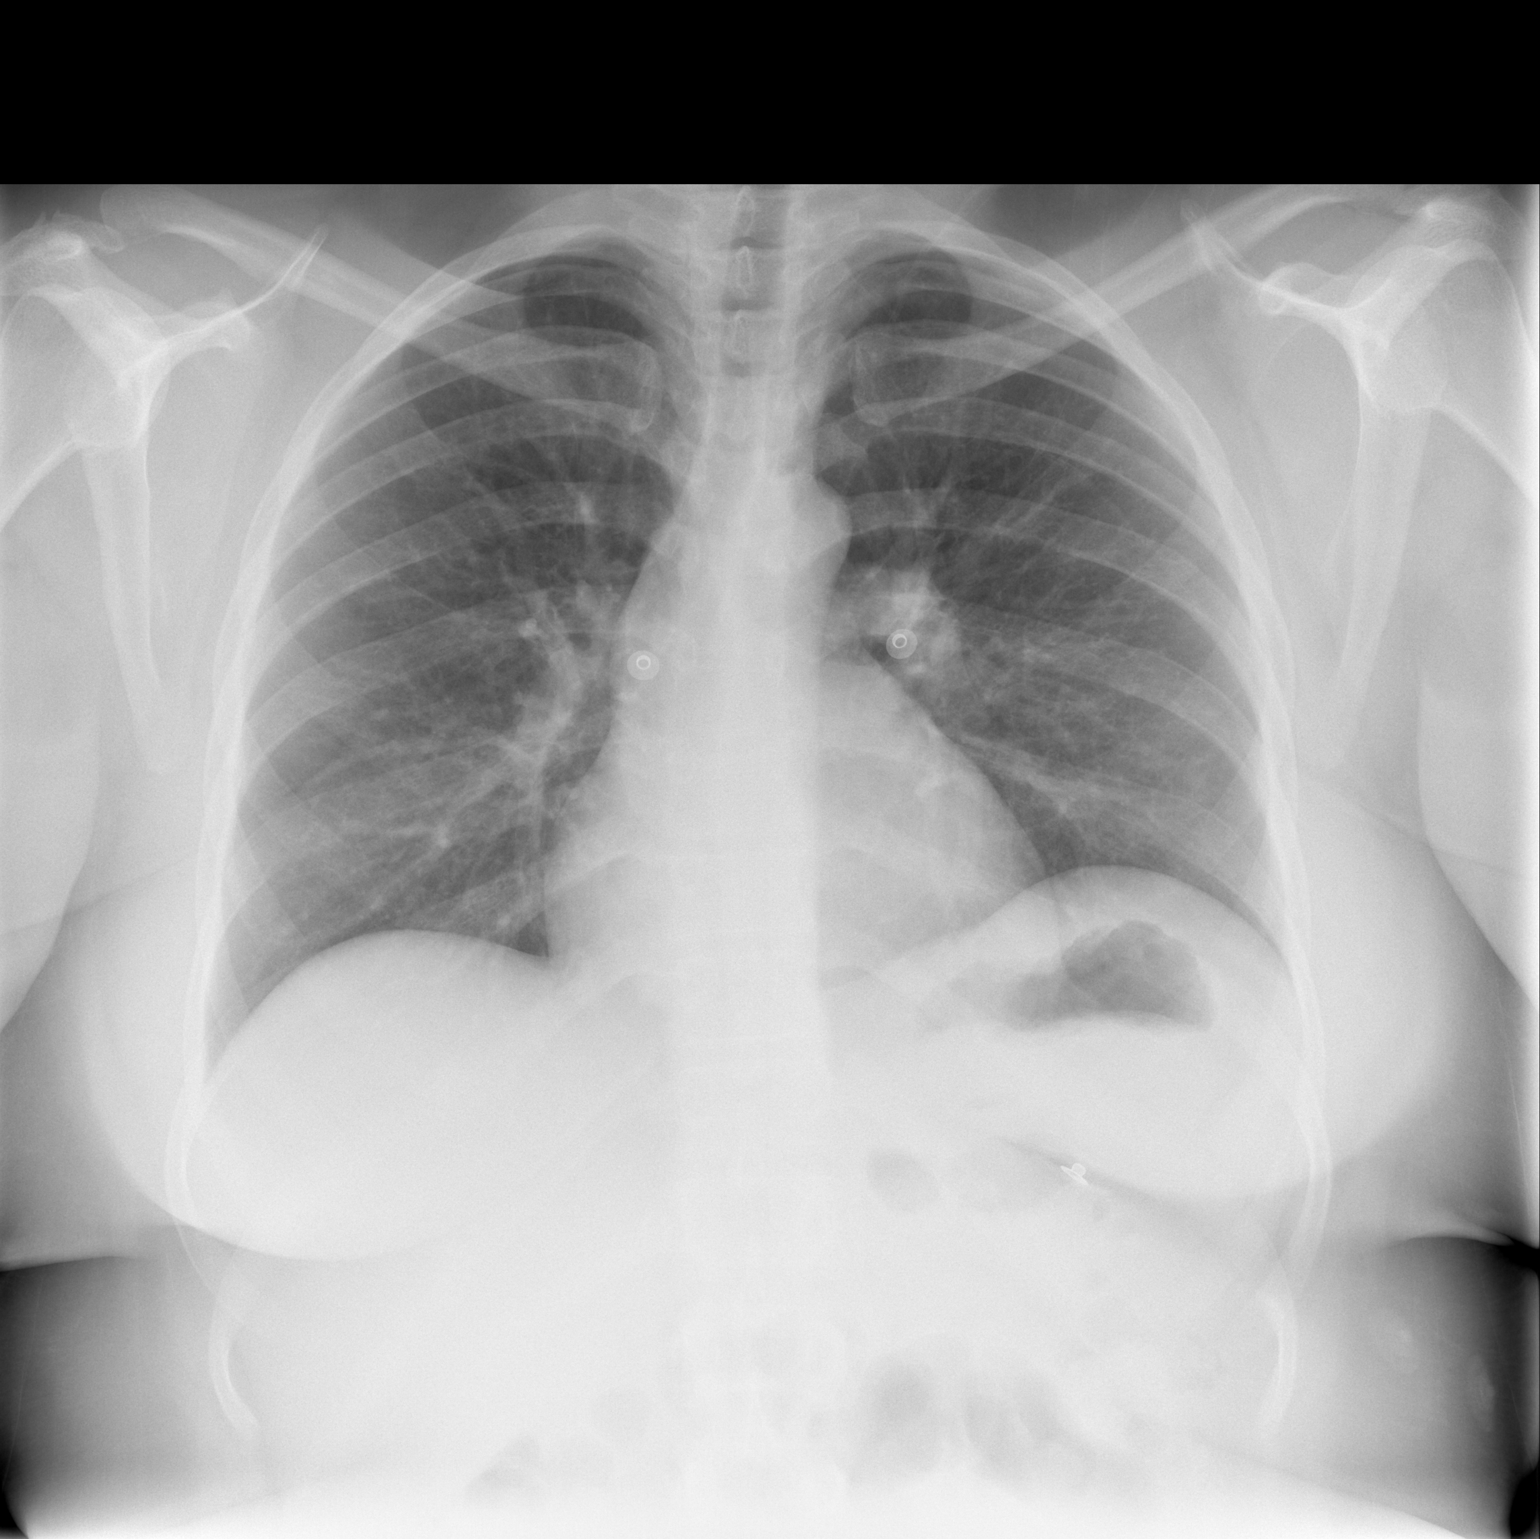

[w chest lat]
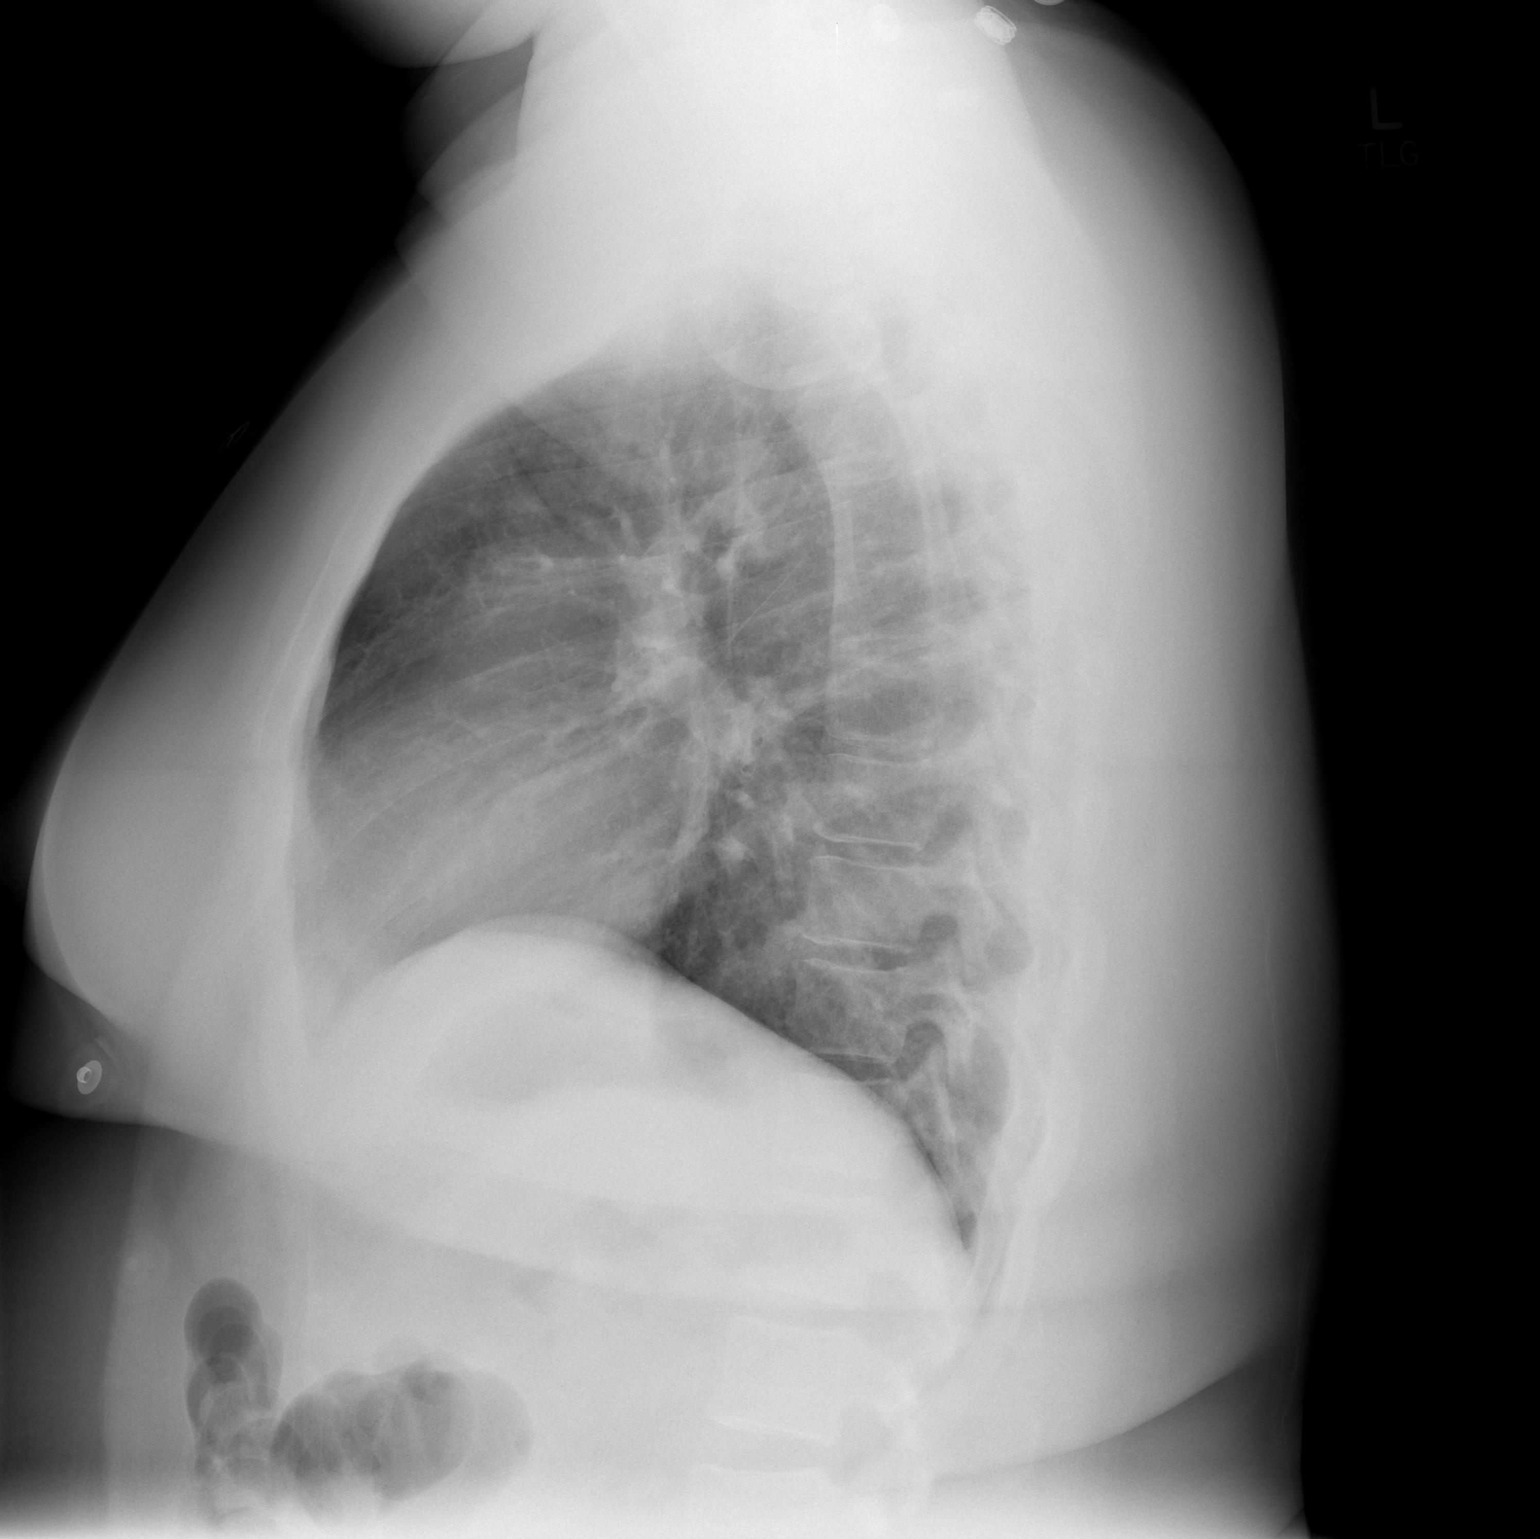

[2 of 2 positions shown; findings below may reference images not displayed]

FINDINGS: Cardiac and mediastinal contours appear normal.

The lungs appear clear.

No pleural effusion is identified. Mildly irregular right acromion,
query os acromiale.
IMPRESSION: No significant abnormality identified.

## 2014-06-19 IMAGING — US US ABDOMEN COMPLETE
1 series · 13 of 25 positions shown · non-contrast
Comparison: None.

CLINICAL DATA: Epigastric pain radiating to the shoulder.
Evaluate for gallstone.  Cholecystitis, cholelithiasis.

COMPLETE ABDOMINAL ULTRASOUND

[Series 1: us abdomen complete · 0.30mm/px · 13 of 84 slices shown]
[im 1/84]
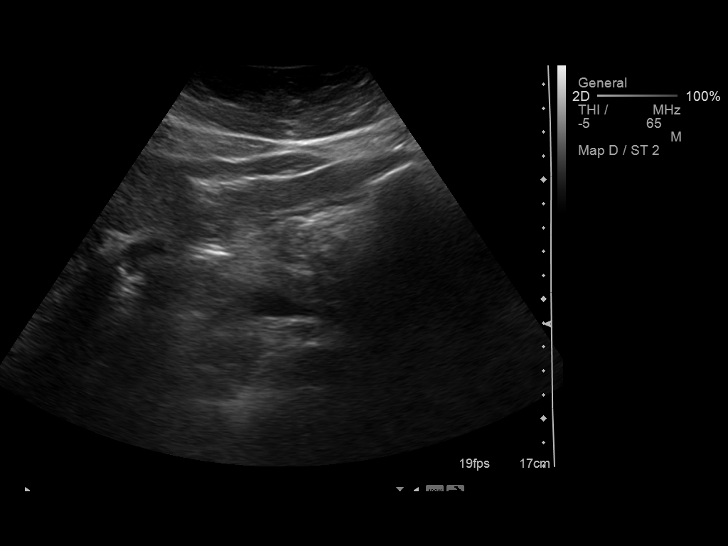
[im 7/84]
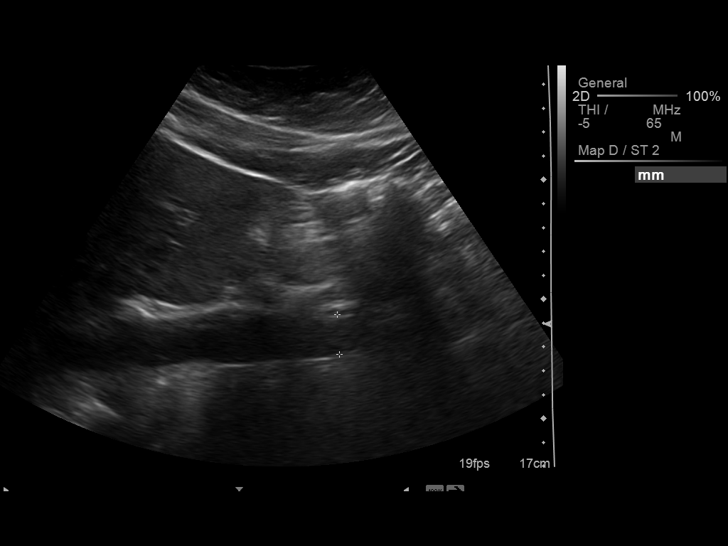
[im 14/84]
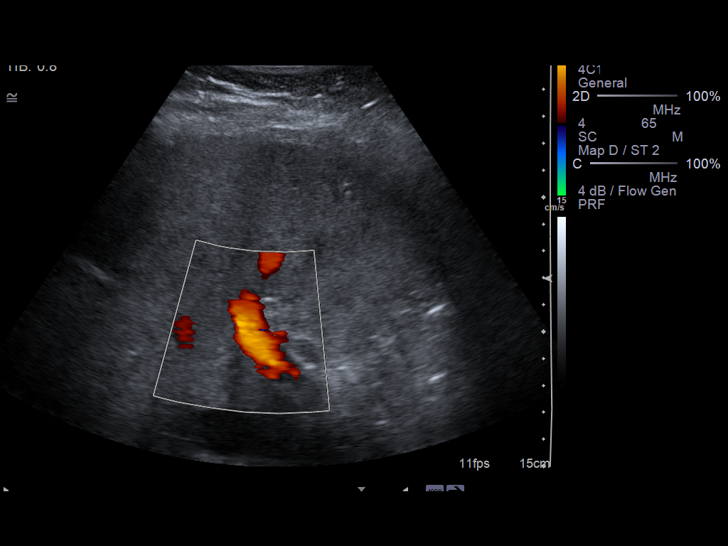
[im 21/84]
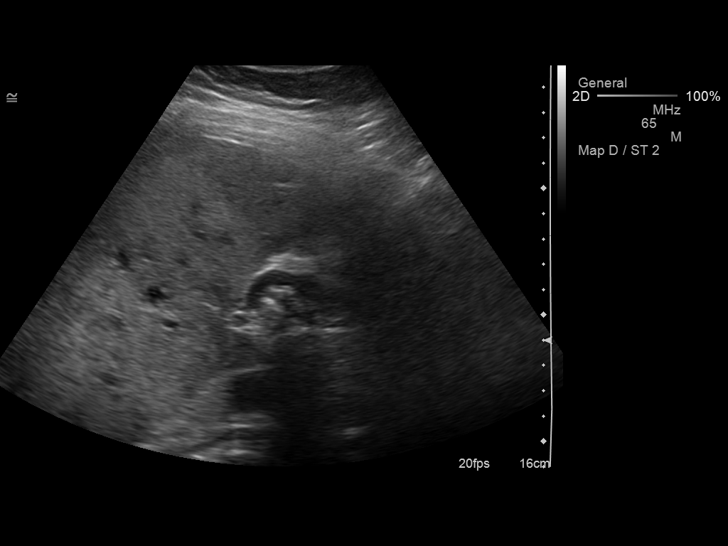
[im 28/84]
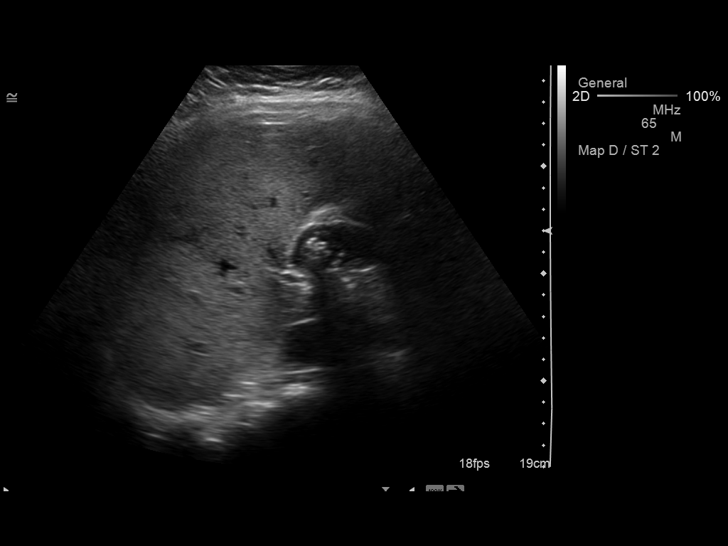
[im 35/84]
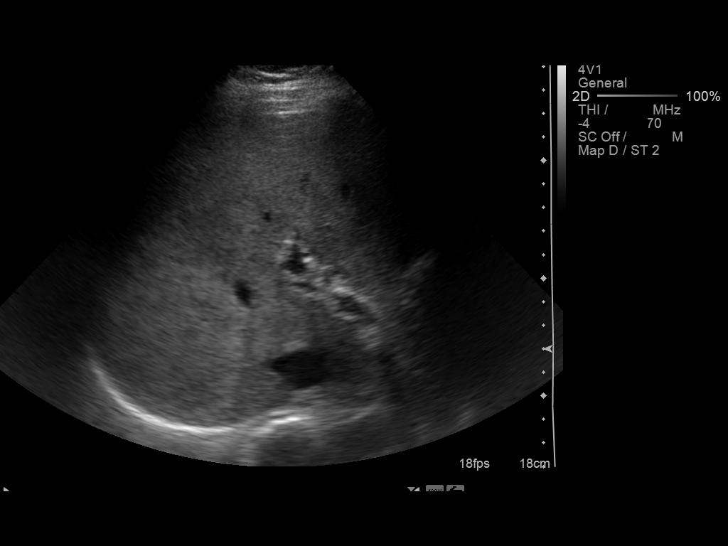
[im 42/84]
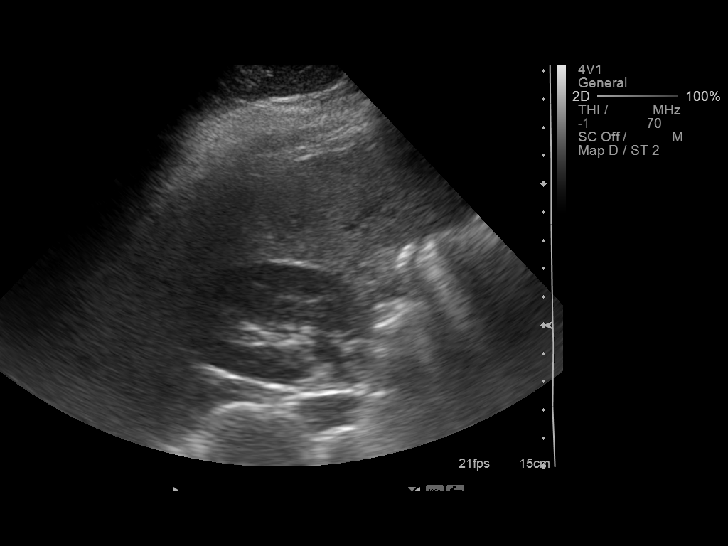
[im 49/84]
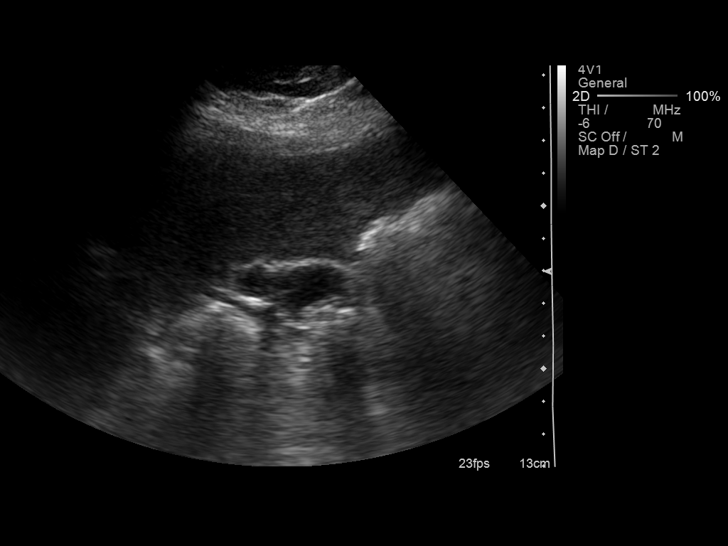
[im 56/84]
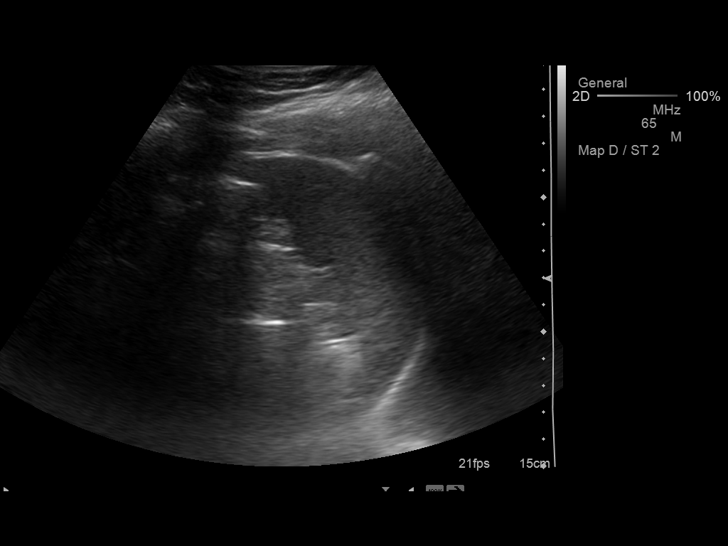
[im 63/84]
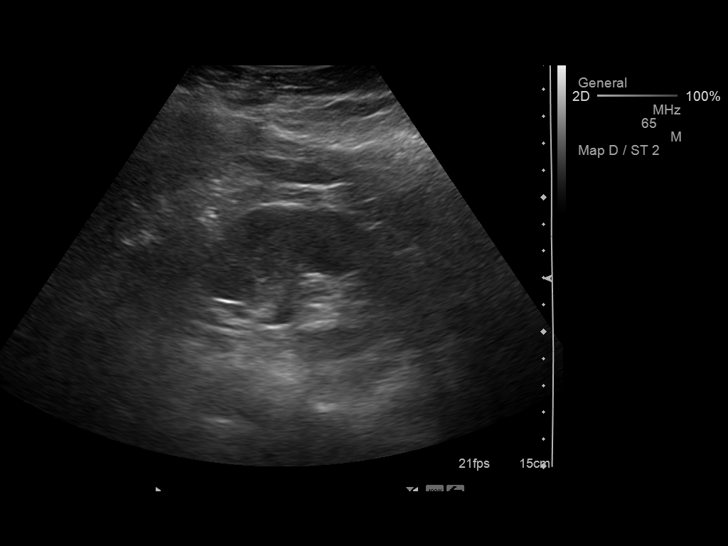
[im 70/84]
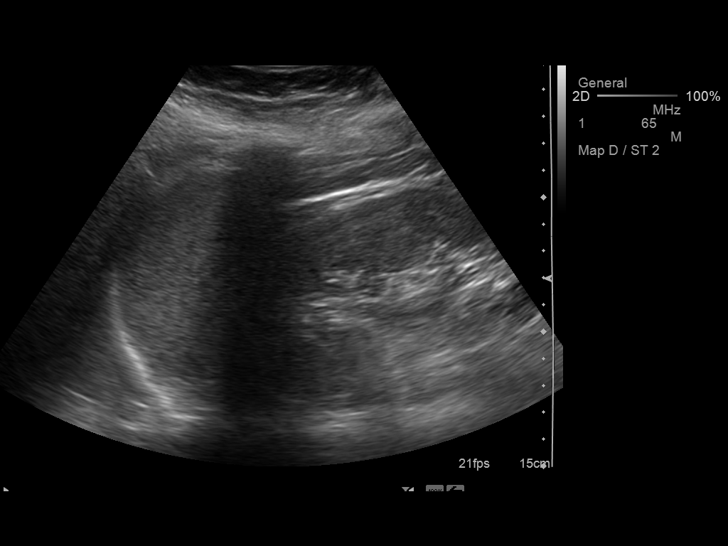
[im 77/84]
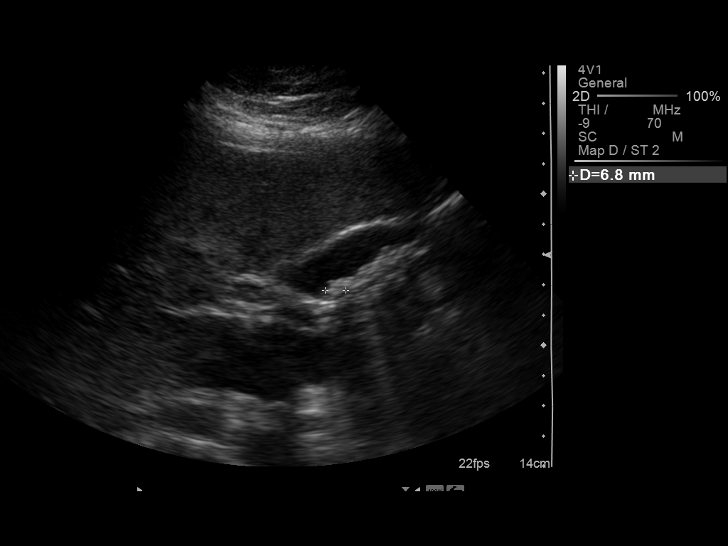
[im 84/84]
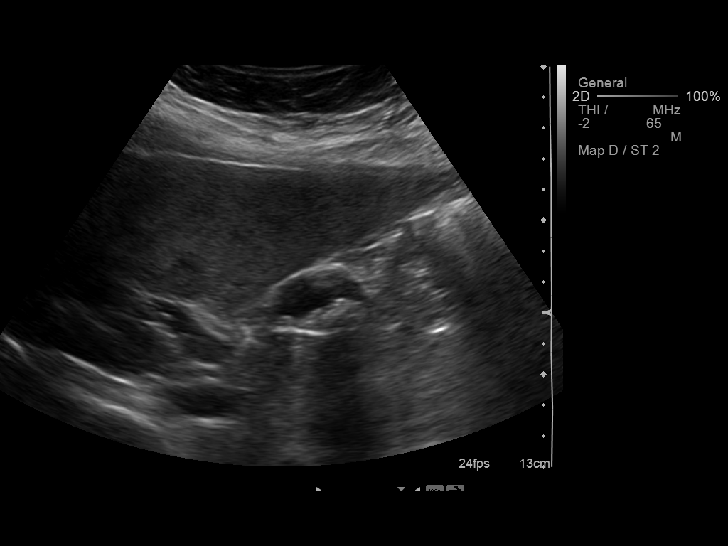

[13 of 25 positions shown; findings below may reference images not displayed]

FINDINGS: Gallbladder:  Cholelithiasis is present.  The largest gallstone
measures 7 mm.  Gallbladder is partially contracted.  There is no
wall thickening. Gallbladder wall measures under 3 mm, normal.  No
sonographic Murphy's sign or pericholecystic fluid.

Common bile duct:  6 mm, upper limits of normal for age.  No common
duct stone is identified.

Liver:  Normal. No intrahepatic biliary ductal dilation or mass
lesion.

IVC:  Normal.

Pancreas:  Suboptimal visualization.  Visualized portions of the
pancreas appear normal.

Spleen:  6.6 cm.  Normal echotexture.

Right Kidney:  11.0 cm. Normal echotexture.  Normal central sinus
echo complex.  No calculi or hydronephrosis.

Left Kidney:  11.7 cm. Normal echotexture.  Normal central sinus
echo complex.  No calculi or hydronephrosis.

Abdominal aorta:  No aneurysm identified.
IMPRESSION: 1.  Cholelithiasis without cholecystitis.
2.  Common bile duct upper limits of normal with, at 6 mm.  No
common duct stone identified.  Correlate with bilirubin.

## 2015-05-11 LAB — OB RESULTS CONSOLE GC/CHLAMYDIA
CHLAMYDIA, DNA PROBE: NEGATIVE
GC PROBE AMP, GENITAL: NEGATIVE

## 2015-05-11 LAB — OB RESULTS CONSOLE ANTIBODY SCREEN: ANTIBODY SCREEN: NEGATIVE

## 2015-05-11 LAB — OB RESULTS CONSOLE ABO/RH: RH Type: POSITIVE

## 2015-05-11 LAB — OB RESULTS CONSOLE RUBELLA ANTIBODY, IGM: RUBELLA: IMMUNE

## 2015-05-11 LAB — OB RESULTS CONSOLE RPR: RPR: NONREACTIVE

## 2015-05-11 LAB — OB RESULTS CONSOLE HIV ANTIBODY (ROUTINE TESTING): HIV: NONREACTIVE

## 2015-05-11 LAB — OB RESULTS CONSOLE HEPATITIS B SURFACE ANTIGEN: Hepatitis B Surface Ag: NEGATIVE

## 2015-06-07 ENCOUNTER — Encounter (HOSPITAL_COMMUNITY): Payer: Self-pay | Admitting: *Deleted

## 2015-06-07 ENCOUNTER — Inpatient Hospital Stay (HOSPITAL_COMMUNITY)
Admission: AD | Admit: 2015-06-07 | Discharge: 2015-06-07 | Disposition: A | Discharge status: Home / Self Care | Payer: BLUE CROSS/BLUE SHIELD | Source: Ambulatory Visit | Attending: Obstetrics and Gynecology | Admitting: Obstetrics and Gynecology

## 2015-06-07 DIAGNOSIS — Z3A10 10 weeks gestation of pregnancy: Secondary | ICD-10-CM | POA: Diagnosis not present

## 2015-06-07 DIAGNOSIS — O209 Hemorrhage in early pregnancy, unspecified: Secondary | ICD-10-CM | POA: Insufficient documentation

## 2015-06-07 NOTE — Discharge Instructions (Signed)

## 2015-06-07 NOTE — MAU Note (Signed)
Will be 10 wks tomorrow.  Noted pinkish blood when in shower, later felt wet- had become red and heavier. Has a pad on

## 2015-07-05 NOTE — L&D Delivery Note (Signed)
Delivery Note At 1:52 PM a viable female was delivered via  (Presentation: ;right Occiput Anterior).  APGAR: 7-9 weight  .  7lb-12oz Placenta status: Intact, Spontaneous.  Cord: 3 vessels with the following complications: moderate meconium  Pt Fundus Firm; bleeding from vaginal lac Anesthesia: None  Episiotomy: None Lacerations:  Left vaginal 2nd degree laceration Suture Repair: vicryl Est. Blood Loss (mL):  900   Mom to postpartum.  Baby to Couplet care / Skin to Skin.  Lori A Clemmons 01/03/2016, 2:30 PM   Baby Name: Naime Breastfeeding

## 2015-12-10 LAB — OB RESULTS CONSOLE GBS: STREP GROUP B AG: POSITIVE

## 2016-01-03 ENCOUNTER — Encounter (HOSPITAL_COMMUNITY): Payer: Self-pay | Admitting: *Deleted

## 2016-01-03 ENCOUNTER — Inpatient Hospital Stay (HOSPITAL_COMMUNITY)
Admission: AD | Admit: 2016-01-03 | Discharge: 2016-01-05 | DRG: 775 | Disposition: A | Discharge status: Home / Self Care | Payer: BLUE CROSS/BLUE SHIELD | Source: Ambulatory Visit | Attending: Obstetrics and Gynecology | Admitting: Obstetrics and Gynecology

## 2016-01-03 DIAGNOSIS — B951 Streptococcus, group B, as the cause of diseases classified elsewhere: Secondary | ICD-10-CM | POA: Diagnosis present

## 2016-01-03 DIAGNOSIS — Z3A39 39 weeks gestation of pregnancy: Secondary | ICD-10-CM | POA: Diagnosis not present

## 2016-01-03 DIAGNOSIS — Z8249 Family history of ischemic heart disease and other diseases of the circulatory system: Secondary | ICD-10-CM

## 2016-01-03 DIAGNOSIS — Z820 Family history of epilepsy and other diseases of the nervous system: Secondary | ICD-10-CM

## 2016-01-03 DIAGNOSIS — IMO0002 Reserved for concepts with insufficient information to code with codable children: Secondary | ICD-10-CM

## 2016-01-03 DIAGNOSIS — Z823 Family history of stroke: Secondary | ICD-10-CM

## 2016-01-03 DIAGNOSIS — Z87891 Personal history of nicotine dependence: Secondary | ICD-10-CM | POA: Diagnosis not present

## 2016-01-03 DIAGNOSIS — Z9049 Acquired absence of other specified parts of digestive tract: Secondary | ICD-10-CM

## 2016-01-03 DIAGNOSIS — Z8611 Personal history of tuberculosis: Secondary | ICD-10-CM

## 2016-01-03 DIAGNOSIS — O34211 Maternal care for low transverse scar from previous cesarean delivery: Secondary | ICD-10-CM | POA: Diagnosis present

## 2016-01-03 DIAGNOSIS — O99824 Streptococcus B carrier state complicating childbirth: Principal | ICD-10-CM | POA: Diagnosis present

## 2016-01-03 DIAGNOSIS — Z833 Family history of diabetes mellitus: Secondary | ICD-10-CM

## 2016-01-03 DIAGNOSIS — Z98891 History of uterine scar from previous surgery: Secondary | ICD-10-CM

## 2016-01-03 DIAGNOSIS — O34219 Maternal care for unspecified type scar from previous cesarean delivery: Secondary | ICD-10-CM | POA: Diagnosis not present

## 2016-01-03 LAB — CBC
HCT: 33.4 % — ABNORMAL LOW (ref 36.0–46.0)
HEMATOCRIT: 36.1 % (ref 36.0–46.0)
HEMOGLOBIN: 12.2 g/dL (ref 12.0–15.0)
Hemoglobin: 11.2 g/dL — ABNORMAL LOW (ref 12.0–15.0)
MCH: 26.6 pg (ref 26.0–34.0)
MCH: 26.5 pg (ref 26.0–34.0)
MCHC: 33.8 g/dL (ref 30.0–36.0)
MCHC: 33.5 g/dL (ref 30.0–36.0)
MCV: 78.8 fL (ref 78.0–100.0)
MCV: 79 fL (ref 78.0–100.0)
Platelets: 231 10*3/uL (ref 150–400)
Platelets: 234 10*3/uL (ref 150–400)
RBC: 4.58 MIL/uL (ref 3.87–5.11)
RBC: 4.23 MIL/uL (ref 3.87–5.11)
RDW: 14.4 % (ref 11.5–15.5)
RDW: 14.4 % (ref 11.5–15.5)
WBC: 10.3 10*3/uL (ref 4.0–10.5)
WBC: 21.6 10*3/uL — ABNORMAL HIGH (ref 4.0–10.5)

## 2016-01-03 LAB — ABO/RH: ABO/RH(D): O POS

## 2016-01-03 LAB — HIV ANTIBODY (ROUTINE TESTING W REFLEX): HIV Screen 4th Generation wRfx: NONREACTIVE

## 2016-01-03 LAB — TYPE AND SCREEN
ABO/RH(D): O POS
ANTIBODY SCREEN: NEGATIVE

## 2016-01-03 LAB — RPR: RPR: NONREACTIVE

## 2016-01-03 MED ORDER — PHENYLEPHRINE 40 MCG/ML (10ML) SYRINGE FOR IV PUSH (FOR BLOOD PRESSURE SUPPORT)
80.0000 ug | PREFILLED_SYRINGE | INTRAVENOUS | Status: DC | PRN
  Filled 2016-01-03: qty 5

## 2016-01-03 MED ORDER — OXYCODONE-ACETAMINOPHEN 5-325 MG PO TABS
2.0000 | ORAL_TABLET | ORAL | Status: DC | PRN
  Administered 2016-01-03 – 2016-01-04 (×2): 2 via ORAL
  Filled 2016-01-03: qty 2

## 2016-01-03 MED ORDER — OXYTOCIN BOLUS FROM INFUSION: 500.0000 mL | INTRAVENOUS | Status: DC

## 2016-01-03 MED ORDER — TETANUS-DIPHTH-ACELL PERTUSSIS 5-2.5-18.5 LF-MCG/0.5 IM SUSP: 0.5000 mL | Freq: Once | INTRAMUSCULAR | Status: DC

## 2016-01-03 MED ORDER — LIDOCAINE HCL (PF) 1 % IJ SOLN
30.0000 mL | INTRAMUSCULAR | Status: DC | PRN
  Administered 2016-01-03: 30 mL via SUBCUTANEOUS
  Filled 2016-01-03: qty 30

## 2016-01-03 MED ORDER — FENTANYL 2.5 MCG/ML BUPIVACAINE 1/10 % EPIDURAL INFUSION (WH - ANES): 14.0000 mL/h | INTRAMUSCULAR | Status: DC | PRN

## 2016-01-03 MED ORDER — PENICILLIN G POTASSIUM 5000000 UNITS IJ SOLR
2.5000 10*6.[IU] | INTRAMUSCULAR | Status: DC
  Administered 2016-01-03: 2.5 10*6.[IU] via INTRAVENOUS
  Filled 2016-01-03 (×10): qty 2.5

## 2016-01-03 MED ORDER — EPHEDRINE 5 MG/ML INJ
10.0000 mg | INTRAVENOUS | Status: DC | PRN
  Filled 2016-01-03: qty 2

## 2016-01-03 MED ORDER — SIMETHICONE 80 MG PO CHEW: 80.0000 mg | CHEWABLE_TABLET | ORAL | Status: DC | PRN

## 2016-01-03 MED ORDER — METHYLERGONOVINE MALEATE 0.2 MG PO TABS: 0.2000 mg | ORAL_TABLET | ORAL | Status: DC | PRN

## 2016-01-03 MED ORDER — ONDANSETRON HCL 4 MG/2ML IJ SOLN
4.0000 mg | Freq: Four times a day (QID) | INTRAMUSCULAR | Status: DC | PRN
  Administered 2016-01-03: 4 mg via INTRAVENOUS
  Filled 2016-01-03: qty 2

## 2016-01-03 MED ORDER — LACTATED RINGERS IV SOLN: 500.0000 mL | Freq: Once | INTRAVENOUS | Status: DC

## 2016-01-03 MED ORDER — ACETAMINOPHEN 325 MG PO TABS: 650.0000 mg | ORAL_TABLET | ORAL | Status: DC | PRN

## 2016-01-03 MED ORDER — FLEET ENEMA 7-19 GM/118ML RE ENEM: 1.0000 | ENEMA | RECTAL | Status: DC | PRN

## 2016-01-03 MED ORDER — SOD CITRATE-CITRIC ACID 500-334 MG/5ML PO SOLN
30.0000 mL | ORAL | Status: DC | PRN
Start: 2016-01-03 — End: 2016-01-04

## 2016-01-03 MED ORDER — OXYCODONE-ACETAMINOPHEN 5-325 MG PO TABS
1.0000 | ORAL_TABLET | ORAL | Status: DC | PRN
  Administered 2016-01-05 (×2): 1 via ORAL

## 2016-01-03 MED ORDER — OXYCODONE-ACETAMINOPHEN 5-325 MG PO TABS
2.0000 | ORAL_TABLET | ORAL | Status: DC | PRN
  Administered 2016-01-03: 2 via ORAL
  Filled 2016-01-03 (×2): qty 2

## 2016-01-03 MED ORDER — WITCH HAZEL-GLYCERIN EX PADS: 1.0000 "application " | MEDICATED_PAD | CUTANEOUS | Status: DC | PRN

## 2016-01-03 MED ORDER — COCONUT OIL OIL
1.0000 "application " | TOPICAL_OIL | Status: DC | PRN
  Administered 2016-01-04: 1 via TOPICAL

## 2016-01-03 MED ORDER — DIPHENHYDRAMINE HCL 50 MG/ML IJ SOLN: 12.5000 mg | INTRAMUSCULAR | Status: DC | PRN

## 2016-01-03 MED ORDER — DOCUSATE SODIUM 100 MG PO CAPS
100.0000 mg | ORAL_CAPSULE | Freq: Two times a day (BID) | ORAL | Status: DC
  Administered 2016-01-03 – 2016-01-05 (×4): 100 mg via ORAL
  Filled 2016-01-03 (×4): qty 1

## 2016-01-03 MED ORDER — ONDANSETRON HCL 4 MG PO TABS
4.0000 mg | ORAL_TABLET | ORAL | Status: DC | PRN
Start: 2016-01-03 — End: 2016-01-05

## 2016-01-03 MED ORDER — DIPHENHYDRAMINE HCL 25 MG PO CAPS: 25.0000 mg | ORAL_CAPSULE | Freq: Four times a day (QID) | ORAL | Status: DC | PRN

## 2016-01-03 MED ORDER — BENZOCAINE-MENTHOL 20-0.5 % EX AERO
1.0000 | INHALATION_SPRAY | CUTANEOUS | Status: DC | PRN
Start: 2016-01-03 — End: 2016-01-05
  Administered 2016-01-05: 1 via TOPICAL
  Filled 2016-01-03 (×3): qty 56

## 2016-01-03 MED ORDER — ZOLPIDEM TARTRATE 5 MG PO TABS: 5.0000 mg | ORAL_TABLET | Freq: Every evening | ORAL | Status: DC | PRN

## 2016-01-03 MED ORDER — DIBUCAINE 1 % RE OINT: 1.0000 "application " | TOPICAL_OINTMENT | RECTAL | Status: DC | PRN

## 2016-01-03 MED ORDER — LACTATED RINGERS IV SOLN
INTRAVENOUS | Status: DC
  Administered 2016-01-03: 08:00:00 via INTRAVENOUS

## 2016-01-03 MED ORDER — IBUPROFEN 600 MG PO TABS
600.0000 mg | ORAL_TABLET | Freq: Four times a day (QID) | ORAL | Status: DC
  Administered 2016-01-03 – 2016-01-05 (×8): 600 mg via ORAL
  Filled 2016-01-03 (×8): qty 1

## 2016-01-03 MED ORDER — PENICILLIN G POTASSIUM 5000000 UNITS IJ SOLR
5.0000 10*6.[IU] | Freq: Once | INTRAVENOUS | Status: AC
  Administered 2016-01-03: 5 10*6.[IU] via INTRAVENOUS
  Filled 2016-01-03: qty 5

## 2016-01-03 MED ORDER — FENTANYL CITRATE (PF) 100 MCG/2ML IJ SOLN: 100.0000 ug | INTRAMUSCULAR | Status: DC | PRN

## 2016-01-03 MED ORDER — OXYTOCIN 40 UNITS IN LACTATED RINGERS INFUSION - SIMPLE MED
2.5000 [IU]/h | INTRAVENOUS | Status: DC
  Administered 2016-01-03: 2.5 [IU]/h via INTRAVENOUS
  Filled 2016-01-03: qty 1000

## 2016-01-03 MED ORDER — LACTATED RINGERS IV SOLN: 500.0000 mL | INTRAVENOUS | Status: DC | PRN

## 2016-01-03 MED ORDER — METHYLERGONOVINE MALEATE 0.2 MG/ML IJ SOLN: 0.2000 mg | INTRAMUSCULAR | Status: DC | PRN

## 2016-01-03 MED ORDER — PRENATAL MULTIVITAMIN CH
1.0000 | ORAL_TABLET | Freq: Every day | ORAL | Status: DC
  Administered 2016-01-04 – 2016-01-05 (×2): 1 via ORAL
  Filled 2016-01-03 (×2): qty 1

## 2016-01-03 MED ORDER — ACETAMINOPHEN 325 MG PO TABS
650.0000 mg | ORAL_TABLET | ORAL | Status: DC | PRN
  Filled 2016-01-03: qty 2

## 2016-01-03 MED ORDER — OXYCODONE-ACETAMINOPHEN 5-325 MG PO TABS
1.0000 | ORAL_TABLET | ORAL | Status: DC | PRN
  Filled 2016-01-03 (×2): qty 1

## 2016-01-03 MED ORDER — ONDANSETRON HCL 4 MG/2ML IJ SOLN: 4.0000 mg | INTRAMUSCULAR | Status: DC | PRN

## 2016-01-03 NOTE — Lactation Note (Signed)
This note was copied from a baby's chart. Lactation Consultation Note  Patient Name: Laura Calderon ZOXWR'UToday's Date: 01/03/2016 Reason for consult: Initial assessment Baby at 6hr of life. Experienced bf mom reports latching is going well. She has Hx low milk supply with her older son and would like to ebf this baby. Offer DEBP but she request Harmony to post pump. Discussed baby behavior, feeding frequency, baby belly size, voids, wt loss, breast changes, and nipple care. Reviewed manual express, mom was eating and stated she has done it in the past. She has a spoon in the room. Given lactation handouts. Aware of OP services and support group. She will call as needed.      Maternal Data Has patient been taught Hand Expression?: Yes Does the patient have breastfeeding experience prior to this delivery?: Yes  Feeding Feeding Type: Breast Fed Length of feed: 40 min  LATCH Score/Interventions Latch: Grasps breast easily, tongue down, lips flanged, rhythmical sucking.  Audible Swallowing: Spontaneous and intermittent Intervention(s): Skin to skin  Type of Nipple: Everted at rest and after stimulation  Comfort (Breast/Nipple): Soft / non-tender     Hold (Positioning): No assistance needed to correctly position infant at breast. Intervention(s): Breastfeeding basics reviewed;Support Pillows;Position options;Skin to skin  LATCH Score: 10  Lactation Tools Discussed/Used WIC Program: No Pump Review: Setup, frequency, and cleaning;Milk Storage Initiated by:: ES Date initiated:: 01/03/16   Consult Status Consult Status: Follow-up Date: 01/04/16 Follow-up type: In-patient    Rulon Eisenmengerlizabeth E Ger Ringenberg 01/03/2016, 8:45 PM

## 2016-01-03 NOTE — Anesthesia Pain Management Evaluation Note (Signed)
  CRNA Pain Management Visit Note  Patient: Laura Calderon, 35 y.o., female  "Hello I am a member of the anesthesia team at [facility]. We have an anesthesia team available at all times to provide care throughout the hospital, including epidural management and anesthesia for C-section. I don't know your plan for the delivery whether it a natural birth, water birth, IV sedation, nitrous supplementation, doula or epidural, but we want to meet your pain goals."   1.Was your pain managed to your expectations on prior hospitalizations?   Yes   2.What is your expectation for pain management during this hospitalization?     Doula  3.How can we help you reach that goal? Natural childbirth with supportive measures.  Record the patient's initial score and the patient's pain goal.   Pain: 7  Pain Goal: 10 [facility] wants you to be able to say your pain was always managed very well.  Bridget Westbrooks 01/03/2016

## 2016-01-03 NOTE — H&P (Signed)
Laura Calderon is a 35 y.o. female, G2P1001 at 339 6/7 weeks, presenting for SROM at 0515, with light MSF noted, irregular contractions.  Patient had previous C/S due to breech presentation, desires TOLAC.  Cervix has been closed on last evaluation in the office.  Denies bleeding, reports +FM.  Patient Active Problem List   Diagnosis Date Noted  . Normal labor 01/03/2016  . Positive GBS test 01/03/2016  . Thin meconium stained amniotic fluid 01/03/2016  . Previous cesarean section--due to breech 01/03/2016  . Encounter for trial of labor 01/03/2016  . Degenerative disc disease   . Anemia 10/09/2011  . Obesity complicating peripregnancy, antepartum 10/08/2011    History of present pregnancy: Entered care in 1st trimester Normal anatomy US Normal growth at 32 weeks, with growth at the 32%ile.; and 7+5, 66%ile, at 37 4/7 weeks. Followed with weekly BPP from 32 weeks. GBS positive at 36 weeks. Desired VBAC from early pregnancy--VBAC calculator 39%ile. Last seen 6/28, with cervix closed, 50%, vtx, -3, BP 110/70, 275 lbs.  Reactive NST.   OB History    Gravida Para Term Preterm AB TAB SAB Ectopic Multiple Living   2 1 1  0 0 0 0 0 0 1    2013--Primary LTCS due to breech, at 37 4/7 weeks, 7+6, female, delivered with [facility]  Past Medical History  Diagnosis Date  . Hypoglycemia   . Headache(784.0)     hx of, none recently  . Urinary tract infection   . Abnormal Pap smear     colposcopy- normal  . Degenerative disc disease     L4 AND L5  . Hx of colposcopy with cervical biopsy   . Tuberculosis 1999  . Orthopnea   . Obesity    Past Surgical History  Procedure Laterality Date  . No past surgeries    . Cesarean section  10/08/2011    Procedure: CESAREAN SECTION;  Surgeon: Hal MoralesVanessa P Haygood, MD;  Location: [facility];  Service: Gynecology;  Laterality: N/A;  Primary Cesarean Section Delivery Boy @ [phone], Apgars 9/9  . Root canal    . Cholecystectomy N/A 11/13/2012    Procedure: LAPAROSCOPIC  CHOLECYSTECTOMY WITH INTRAOPERATIVE CHOLANGIOGRAM;  Surgeon: Lodema PilotBrian Layton, DO;  Location: [facility] OR;  Service: General;  Laterality: N/A;   Family History: family history includes Anemia in her maternal grandmother and mother; Cancer in her maternal aunt and maternal grandmother; Diabetes in her maternal aunt, maternal grandmother, mother, and paternal grandmother; Hypertension in her father and paternal grandmother; Lupus in her maternal aunt; Multiple sclerosis in her paternal aunt; Sleep apnea in her father; Stroke in her maternal aunt and maternal grandfather. There is no history of Anesthesia problems.   Social History:  reports that she has quit smoking. She has quit using smokeless tobacco. She reports that she drinks alcohol. She reports that she does not use illicit drugs.  Patient is African American, married to United ParcelLuguzy Calderon, who is involved and supportive.  She is college educated and employed.   Prenatal Transfer Tool  Maternal Diabetes: No Genetic Screening: Normal Panorama Maternal Ultrasounds/Referrals: Normal Fetal Ultrasounds or other Referrals:  None Maternal Substance Abuse:  No Significant Maternal Medications:  None Significant Maternal Lab Results: Lab values include: Group B Strep positive  TDAP declined Flu 07/29/15  ROS:  Contractions, leaking of MSF, +FM  No Known Allergies   Dilation: 1 Effacement (%): 80 Station: -1 Exam by:: Laura Calderon. Laura Calderon CNM Blood pressure 134/87, pulse 97, temperature 98 F (36.7 C), temperature source Oral,  resp. rate 18, height 5\' 10"  (1.778 m), weight 124.286 kg (274 lb).  Chest clear Heart RRR without murmur Abd gravid, NT, FH 40 cm Pelvic: As above, leaking light MSF Ext: WNL  FHR: Category 2 initially, with 2 late decels, now Category 1 after VE, scalp stim, and initiation of IV hydration UCs:  q 4-6 min, mild/moderate  Prenatal labs: ABO, Rh: O/Positive/-- (11/07 0000)O+ Antibody: Negative (11/07 0000)Neg Rubella:  Immune RPR:  Nonreactive (11/07 0000) NR HBsAg: Negative (11/07 0000) Neg HIV: Non-reactive (11/07 0000) NR GBS: Positive (06/08 0000) Positive 12/10/15 Sickle cell/Hgb electrophoresis:  AA Pap:  WNL 2015 GC:  Negative 05/11/15 Chlamydia:  Negative 05/11/15 Genetic screenings:  Normal panorama, female Glucola:  WNL Other:   Hgb 13.2 at NOB, 10.8 at 28 weeks TSH WNL 09/2015 Vit D 28 on 09/14/15       Assessment/Plan: IUP at 39 6/7 weeks SROM at 0515, light MSF Previous C/S for breech, desires VBAC GBS positive Fibroid--approx 2 cm BMI 39.5 AMA--normal panorama   Plan: Admit to Birthing Suite per consult with Dr. Charlotta Newtonzan Routine [facility] orders Pain med/epidural prn PCN G for GBS prophylaxis  VBAC consent from office placed in shadow chart Risks and benefits of TOLAC were reviewed with patient and husband, including risk of another C/S, uterine rupture, with attendant morbidity/mortality risk to mother and baby, or complications of labor and/or surgery.  Patient and husband seem to understand these risks and desire to proceed with TOLAC.  Laura Calderon, VICKICNM, [place] 01/03/2016, 7:47 AM

## 2016-01-03 NOTE — Progress Notes (Signed)
Laura Calderon is a 35 y.o. G2P1001 at 3539w6dadmitted for rupture of membranes @0515  today with light/moderate meconium  Subjective:  UnComfortable with  Labor support without medications Doula and husband at bs Contractions every 2-3 minutes, lasting 60 seconds, intensity 6/10    Objective: BP 134/87 mmHg  Pulse 97  Temp(Src) 98.3 F (36.8 C) (Oral)  Resp 18  Ht 5\' 10"  (1.778 m)  Wt 274 lb (124.286 kg)  BMI 39.32 kg/m2   Total I/O In: 120 [P.O.:120] Out: -   FHT:  FHR: Category 1 variability: moderate,  accelerations:  Present,  decelerations:  Absent  SVE:   Dilation: 2 Effacement (%): 80 Station: -2 Exam by:: Illene BolusLori Clemmons, CNM   Labs: Lab Results  Component Value Date   WBC 10.3 01/03/2016   HGB 12.2 01/03/2016   HCT 36.1 01/03/2016   MCV 78.8 01/03/2016   PLT 231 01/03/2016    Assessment / Plan:  TOLAC GBS positive SROM Mec Stained fluid Spontaneous labor, progressing normally Fetal Wellbeing: reassuring Anticipated MOD:  NSVD  Lori A Clemmons 01/03/2016, 10:42 AM

## 2016-01-03 NOTE — MAU Note (Signed)
Water broke at 5:15am contractions every 2 minutes

## 2016-01-03 NOTE — Progress Notes (Signed)
Laura Calderon is a 35 y.o. G2P1001 at 1837w6d admitted for rupture of membranes  Subjective:  Comfortable with  [facility] Doing well managing contractions Contractions every 3-5 minutes, lasting 60 seconds, intensity 8/10    Objective: BP 129/73 mmHg  Pulse 92  Temp(Src) 98 F (36.7 C) (Axillary)  Resp 18  Ht 5\' 10"  (1.778 m)  Wt 274 lb (124.286 kg)  BMI 39.32 kg/m2   Total I/O In: 180 [P.O.:180] Out: -   FHT:  FHR: 1405 bpm, variability: moderate,  accelerations:  Present,  decelerations:  Absent SVE:   3-4/100/-1  Labs: Lab Results  Component Value Date   WBC 10.3 01/03/2016   HGB 12.2 01/03/2016   HCT 36.1 01/03/2016   MCV 78.8 01/03/2016   PLT 231 01/03/2016    Assessment / Plan: Spontaneous labor, progressing normally Fetal Wellbeing: reassuring Anticipated MOD:  NSVD  Laura Calderon 01/03/2016, 12:48 PM

## 2016-01-03 NOTE — Progress Notes (Signed)
Subjective: Pt is doing well. Breathing through contractions. Fhr is reassuring. Category 1. Pt was previous c/s for breech. Advised to place IUPC and start pitocin and pt requests to wait before pitocin or internal monitoring is placed. I suggested as Laura plan to recheck her cervix in 2 hours and if she had not dilated more, I would place IUPC and start pitocin and or if the fetal heart tones warranted those interventions at any time between now and then. Pt is agreeable to POC.  Objective: BP 134/87 mmHg  Pulse 97  Temp(Src) 98 F (36.7 C) (Oral)  Resp 18  Ht 5\' 10"  (1.778 m)  Wt 274 lb (124.286 kg)  BMI 39.32 kg/m2      FHT: Category UC:   regular, every 3-4 minutes SVE:   Dilation: 1 Effacement (%): 80 Station: -1 Exam by:: Manfred ArchV. Latham CNM @ 630    Assessment:  IUP @ 39+6 SROM-lt meconium Tolac GBS + Plan: Continuous EFM ABX TX for GBS + Recheck cervix in 2 hours ;place IUPC and start pitocin if no cervical change Anticipate vaginal delivery  Laura Calderon Laura Calderon CNM, [place] 01/03/2016, 8:26 AM

## 2016-01-03 NOTE — Progress Notes (Addendum)
Came by to see patient. RN reports 100 cc urine output since delivery, with evidence of 300-600 cc in bladder on scan. Good fluid intake. Patient had declined I&O cath.  Appears well, sitting up in bed, no syncope or dizziness.  Had deep 2nd degree vaginal tear, with EBL 900 cc from laceration during delivery. Chest clear Heart RRR without murmur Abd appropriately tender, fundus firm. Perineum edematous, but NT. Lochia WNL Ext WNL  CBC Latest Ref Rng 01/03/2016 01/03/2016 11/12/2012  WBC 4.0 - 10.5 K/uL 21.6(H) 10.3 8.8  Hemoglobin 12.0 - 15.0 g/dL 11.2(L) 12.2 14.1  Hematocrit 36.0 - 46.0 % 33.4(L) 36.1 42.6  Platelets 150 - 400 K/uL 234 231 298     Advised patient if no spontaneous void by 9pm, will need I&O cath. If repeat cath is needed during night, will plan foley cath placement. Patient agreeable with plan. Repeat H/H in am.  Nigel BridgemanVicki Torunn Chancellor, CNM 01/03/16 8p  Addendum: Voiding spontaneously at 9pm, approx 250 cc. Instructed RN to have patient get up to void q 2 hours while awake. To f/u with me with any issues.  Nigel BridgemanVicki Eevie Lapp, CNM 01/03/16 9:14p

## 2016-01-04 LAB — HEMOGLOBIN AND HEMATOCRIT, BLOOD
HCT: 29.7 % — ABNORMAL LOW (ref 36.0–46.0)
Hemoglobin: 9.7 g/dL — ABNORMAL LOW (ref 12.0–15.0)

## 2016-01-04 NOTE — Progress Notes (Signed)
Post Partum Day 1 Subjective: no complaints, up ad lib, voiding and tolerating PO  Objective: Blood pressure 106/60, pulse 86, temperature 98 F (36.7 C), temperature source Oral, resp. rate 18, height 5\' 10"  (1.778 m), weight 274 lb (124.286 kg), SpO2 100 %, unknown if currently breastfeeding.  Physical Exam:  General: alert and cooperative Lochia: appropriate Uterine Fundus: firm Incision: na DVT Evaluation: No evidence of DVT seen on physical exam.   Recent Labs  01/03/16 1556 01/04/16 0541  HGB 11.2* 9.7*  HCT 33.4* 29.7*    Assessment/Plan: Plan for discharge tomorrow and Breastfeeding   LOS: 1 day   Jamelyn Bovard A 01/04/2016, 1:14 PM

## 2016-01-05 MED ORDER — NORETHINDRONE 0.35 MG PO TABS: 1.0000 | ORAL_TABLET | Freq: Every day | ORAL | Status: DC

## 2016-01-05 MED ORDER — OXYCODONE-ACETAMINOPHEN 5-325 MG PO TABS: 1.0000 | ORAL_TABLET | ORAL | Status: DC | PRN

## 2016-01-05 MED ORDER — IBUPROFEN 600 MG PO TABS: 600.0000 mg | ORAL_TABLET | Freq: Four times a day (QID) | ORAL | Status: DC

## 2016-01-05 NOTE — Discharge Summary (Signed)
  Obstetric Discharge Summary  Reason for Admission: onset of labor on 01/02/16 Prenatal Procedures: none Intrapartum Procedures: spontaneous vaginal delivery by Illene BolusLori Clemmons CNM on 01/03/16 successful VBAC Postpartum Procedures: none Complications-Operative and Postpartum: 2nd degree perineal laceration  HEMOGLOBIN  Date Value Ref Range Status  01/04/2016 9.7* 12.0 - 15.0 g/dL Final   HCT  Date Value Ref Range Status  01/04/2016 29.7* 36.0 - 46.0 % Final    Discharge Diagnoses: Term Pregnancy-delivered  Physical exam:   General: normal Lochia: appropriate Uterine Fundus: 0/2 firm non-tender  Extremities: No evidence of DVT seen on physical exam. Edema 1+   Hospital course: uncomplicated  Date: 01/05/2016 Activity: unrestricted Diet: routine Medications: Ibuprofen and Percocet Condition: stable  Breastfeeding:   Yes.   Contraception:  Micronor  Instructions: refer to practice specific booklet Discharge to: home   Newborn Data:   Baby female Name: Dorothey Basemanaima  Jaymarion Trombly A MD 01/05/2016, 8:00 AM

## 2016-01-05 NOTE — Lactation Note (Signed)
This note was copied from a baby's chart. Lactation Consultation Note Worked with mother on deeper latch and breast compression to aid in transfer of milk.  Baby ate 2 x on both breasts and was asleep after that.  Advised to continue feeding on cue.  Encouraged mother to use techniques taught today and to add post-pumping for 10 minutes 6 times in 24 hours as well as hand expression 4-5 times a day. Instructed to feed this milk back to the baby.   Informed mom of support groups and outpatient services.   Patient Name: Laura Calderon: 01/05/2016 Reason for consult: Follow-up assessment   Maternal Data    Feeding Feeding Type: Breast Fed Length of feed: 15 min  LATCH Score/Interventions Latch: Grasps breast easily, tongue down, lips flanged, rhythmical sucking.  Audible Swallowing: A few with stimulation  Type of Nipple: Everted at rest and after stimulation  Comfort (Breast/Nipple): Filling, red/small blisters or bruises, mild/mod discomfort  Problem noted: Mild/Moderate discomfort  Hold (Positioning): No assistance needed to correctly position infant at breast.  LATCH Score: 8  Lactation Tools Discussed/Used     Consult Status      Soyla DryerJoseph, Modell Fendrick 01/05/2016, 11:33 AM

## 2016-01-05 NOTE — Discharge Instructions (Signed)

## 2016-01-18 ENCOUNTER — Telehealth (HOSPITAL_COMMUNITY): Payer: Self-pay | Admitting: Lactation Services

## 2016-01-18 NOTE — Telephone Encounter (Signed)
Telephone Call - Lactation  Spoke with mom - requesting a LC O/P appt. Due to slow weight gain and possible frenulum . DOB - 7/2.  Per mom was seen by community Lactation Consult Edd ArbourJamilla Walker, RN, IBCLC on 7/6.  Per mom @ the consult with Jamilla - weight was 7.1 oz , no post  weight was done at that time.  [facility] mentioned she thought the baby had a tongue- tie and gave her a name - Dr. Best boyMcMurtry/ dentist  [place] in [place]. Per mom felt she needed a 2nd opinion from a LC before going the distance.  Per mom has been pumping with a DEBP - [place] , but low yield after the baby feeds 15 -30 ml max., and it seems As the day  progresses her breast aren't has full as the morning.  Baby's birth weight was 7-12 oz, D/C weight 7-6 oz, and 1st Dr. Visit 6-9 oz , and baby was only wetting once a day.  Pedis had mom supplementing 2 oz after feedings , and the last weight check last Friday at the [place] [facility] - 7-6 oz.  Weight gain has been slow , and baby is 402 week old and a few days. Also mom mentioned she has sore nipples.  LC recommended to continue feeding at the breast , if to full to start , express off enough so baby can obtain depth with  Breast compressions. Watch for non - nutritive feeding patterns , and don't allow the baby to hang out.  Offer 2nd breast , supplement afterwards until appt. Tomorrow at 2:30 pm . [facility]

## 2016-01-18 NOTE — Telephone Encounter (Signed)
Lactation return phone call - this mom gave permission to leave a message.  mom called 7/16 at 459 p. requesting LC O/P appt. due to baby's weight loss. LC called this mom back this am 9:10 am and obtained her voice mail.  LC message was there are no appt. Available for this week , sometimes there  Are cancellations and recommended to call back daily to see when appt. Available. LC also recommended to call back and leave a message for [facility] for the best time to contact her  To discuss the weight loss and have a plan prior to making appt.

## 2016-01-19 ENCOUNTER — Ambulatory Visit (HOSPITAL_COMMUNITY)
Admission: RE | Admit: 2016-01-19 | Discharge: 2016-01-19 | Disposition: A | Discharge status: Home / Self Care | Payer: BLUE CROSS/BLUE SHIELD | Source: Ambulatory Visit | Attending: Internal Medicine | Admitting: Internal Medicine

## 2016-01-19 NOTE — Lactation Note (Signed)
Lactation Consult  Mother's reason for visit: Infant is 6716 days old.  Infant had lost weight the first week of life . Mother has been supplementing infant 2 ounces of formula every 2-3 hours after breast feeding. Mother states that infant may have a tongue tie. Mother has not been pumping her breast consistently.   Visit Type: feeding assessment  :  Consult:  Initial Lactation Consultant:  Michel BickersKendrick, Arnett Galindez McCoy  ________________________________________________________________________    ________________________________________________________________________  Mother's Name: Laura Calderon Type of delivery:  vaginal del  Breastfeeding Experience:  none Maternal Medical Conditions:  none Maternal Medications:  Prenatal vits  ________________________________________________________________________  Breastfeeding History (Post Discharge)  Frequency of breastfeeding: every 2-3 hours Duration of feeding: 10-15 mins on each    Pumping  Type of pump:  Manual and electric Freemie Frequency:  Once daily Volume: few ml  Infant Intake and Output Assessment  Voids:8  in 24 hrs.  Color:  Clear yellow Stools: 4 in 24 hrs.  Color:  Yellow  ________________________________________________________________________  Maternal Breast Assessment  Breast:  Full Nipple:  Erect Pain level:  0 Pain interventions:  Bra  _______________________________________________________________________  Initial feeding assessment:Mother has good technique when latching infant . Mother has a crack on the upper shaft of the (R) nipple.  Mother latched infant and only slight adjustment of infants lower jaw was needed. Infant sustained latch with non nutritive suckling for 10-15 mins and transferred only 8 ml.  Infant latched to alternat breast and no milk transfer was obtained.   Infant's oral assessment:  Variance  Positioning:  Cross cradle Right breast  LATCH documentation:  Latch:  2 =  Grasps breast easily, tongue down, lips flanged, rhythmical sucking.  Audible swallowing:  2 = Spontaneous and intermittent  Type of nipple:  2 = Everted at rest and after stimulation  Comfort (Breast/Nipple):  1 = Filling, red/small blisters or bruises, mild/mod discomfort  Hold (Positioning):  1 = Assistance needed to correctly position infant at breast and maintain latch  LATCH score:  8  Attached assessment:  Deep  Lips flanged:  Yes.    Lips untucked:  Yes.    Suck assessment:  Displays both   Pre-feed weight:  3534, 7-12.7 Post-feed weight:   Amount transferred:     Additional Feeding Assessment -   Infant's oral assessment:  Variance  Positioning:  Cross cradle Left breast  LATCH documentation:  Latch:  2 = Grasps breast easily, tongue down, lips flanged, rhythmical sucking.  Audible swallowing:  2 = Spontaneous and intermittent  Type of nipple:  2 = Everted at rest and after stimulation  Comfort (Breast/Nipple):  1 = Filling, red/small blisters or bruises, mild/mod discomfort  Hold (Positioning):  1 = Assistance needed to correctly position infant at breast and maintain latch  LATCH score:  8  Attached assessment:  Deep  Lips flanged:  Yes.    Lips untucked:  Yes.    Suck assessment:  Displays both    Pre-feed weight:  3542 Post-feed weight:  3540 Amount transferred:  No milk transfer Amount supplemented:  90 ml   Total amount transferred:  8 ml Total supplement given:  90 ml of formula  Advised mother to call her OB and request APNO Rx. Continue to breastfeed every 2-3 hours. Supplement infant with 2-3 ounces after each breastfeeding. Post pump 6 times daily for 15-20 mins. Mother to take supplements as discussed Follow up with Peds in 2 weeks Follow up to [facility]

## 2018-03-11 DIAGNOSIS — J020 Streptococcal pharyngitis: Secondary | ICD-10-CM | POA: Diagnosis not present

## 2018-03-23 DIAGNOSIS — F331 Major depressive disorder, recurrent, moderate: Secondary | ICD-10-CM | POA: Diagnosis not present

## 2018-04-14 DIAGNOSIS — F331 Major depressive disorder, recurrent, moderate: Secondary | ICD-10-CM | POA: Diagnosis not present

## 2018-04-20 DIAGNOSIS — F331 Major depressive disorder, recurrent, moderate: Secondary | ICD-10-CM | POA: Diagnosis not present

## 2018-05-14 DIAGNOSIS — M545 Low back pain: Secondary | ICD-10-CM | POA: Diagnosis not present

## 2018-05-28 DIAGNOSIS — J209 Acute bronchitis, unspecified: Secondary | ICD-10-CM | POA: Diagnosis not present

## 2018-05-28 DIAGNOSIS — J069 Acute upper respiratory infection, unspecified: Secondary | ICD-10-CM | POA: Diagnosis not present

## 2018-07-02 DIAGNOSIS — F331 Major depressive disorder, recurrent, moderate: Secondary | ICD-10-CM | POA: Diagnosis not present

## 2018-07-09 DIAGNOSIS — M9907 Segmental and somatic dysfunction of upper extremity: Secondary | ICD-10-CM | POA: Diagnosis not present

## 2018-07-09 DIAGNOSIS — M7541 Impingement syndrome of right shoulder: Secondary | ICD-10-CM | POA: Diagnosis not present

## 2018-07-09 DIAGNOSIS — M9902 Segmental and somatic dysfunction of thoracic region: Secondary | ICD-10-CM | POA: Diagnosis not present

## 2018-07-09 DIAGNOSIS — M9901 Segmental and somatic dysfunction of cervical region: Secondary | ICD-10-CM | POA: Diagnosis not present

## 2018-07-12 DIAGNOSIS — M9902 Segmental and somatic dysfunction of thoracic region: Secondary | ICD-10-CM | POA: Diagnosis not present

## 2018-07-12 DIAGNOSIS — M7541 Impingement syndrome of right shoulder: Secondary | ICD-10-CM | POA: Diagnosis not present

## 2018-07-12 DIAGNOSIS — M9901 Segmental and somatic dysfunction of cervical region: Secondary | ICD-10-CM | POA: Diagnosis not present

## 2018-07-12 DIAGNOSIS — M9907 Segmental and somatic dysfunction of upper extremity: Secondary | ICD-10-CM | POA: Diagnosis not present

## 2018-07-17 DIAGNOSIS — M9901 Segmental and somatic dysfunction of cervical region: Secondary | ICD-10-CM | POA: Diagnosis not present

## 2018-07-17 DIAGNOSIS — M9902 Segmental and somatic dysfunction of thoracic region: Secondary | ICD-10-CM | POA: Diagnosis not present

## 2018-07-17 DIAGNOSIS — M9907 Segmental and somatic dysfunction of upper extremity: Secondary | ICD-10-CM | POA: Diagnosis not present

## 2018-07-17 DIAGNOSIS — M7541 Impingement syndrome of right shoulder: Secondary | ICD-10-CM | POA: Diagnosis not present

## 2018-07-19 DIAGNOSIS — E669 Obesity, unspecified: Secondary | ICD-10-CM | POA: Diagnosis not present

## 2018-07-19 DIAGNOSIS — Z113 Encounter for screening for infections with a predominantly sexual mode of transmission: Secondary | ICD-10-CM | POA: Diagnosis not present

## 2018-07-19 DIAGNOSIS — Z131 Encounter for screening for diabetes mellitus: Secondary | ICD-10-CM | POA: Diagnosis not present

## 2018-07-19 DIAGNOSIS — Z6836 Body mass index (BMI) 36.0-36.9, adult: Secondary | ICD-10-CM | POA: Diagnosis not present

## 2018-07-19 DIAGNOSIS — Z1151 Encounter for screening for human papillomavirus (HPV): Secondary | ICD-10-CM | POA: Diagnosis not present

## 2018-07-19 DIAGNOSIS — Z01419 Encounter for gynecological examination (general) (routine) without abnormal findings: Secondary | ICD-10-CM | POA: Diagnosis not present

## 2018-07-19 DIAGNOSIS — Z1329 Encounter for screening for other suspected endocrine disorder: Secondary | ICD-10-CM | POA: Diagnosis not present

## 2018-07-19 DIAGNOSIS — N898 Other specified noninflammatory disorders of vagina: Secondary | ICD-10-CM | POA: Diagnosis not present

## 2018-07-19 DIAGNOSIS — Z01411 Encounter for gynecological examination (general) (routine) with abnormal findings: Secondary | ICD-10-CM | POA: Diagnosis not present

## 2018-07-19 DIAGNOSIS — R232 Flushing: Secondary | ICD-10-CM | POA: Diagnosis not present

## 2018-07-19 DIAGNOSIS — Z118 Encounter for screening for other infectious and parasitic diseases: Secondary | ICD-10-CM | POA: Diagnosis not present

## 2018-09-11 DIAGNOSIS — F331 Major depressive disorder, recurrent, moderate: Secondary | ICD-10-CM | POA: Diagnosis not present

## 2018-09-25 DIAGNOSIS — F331 Major depressive disorder, recurrent, moderate: Secondary | ICD-10-CM | POA: Diagnosis not present

## 2018-11-09 DIAGNOSIS — F331 Major depressive disorder, recurrent, moderate: Secondary | ICD-10-CM | POA: Diagnosis not present

## 2018-11-29 DIAGNOSIS — F331 Major depressive disorder, recurrent, moderate: Secondary | ICD-10-CM | POA: Diagnosis not present

## 2018-12-27 DIAGNOSIS — F331 Major depressive disorder, recurrent, moderate: Secondary | ICD-10-CM | POA: Diagnosis not present

## 2019-01-25 DIAGNOSIS — F331 Major depressive disorder, recurrent, moderate: Secondary | ICD-10-CM | POA: Diagnosis not present

## 2019-03-19 DIAGNOSIS — Z03818 Encounter for observation for suspected exposure to other biological agents ruled out: Secondary | ICD-10-CM | POA: Diagnosis not present

## 2019-05-02 ENCOUNTER — Telehealth: Payer: Self-pay | Admitting: Internal Medicine

## 2019-05-02 NOTE — Telephone Encounter (Signed)
Pt called to see if you would accept her as a new patient  Her Mom Margarita Grizzle 05/27/58 and her grandmother Ali Lowe 01/18/43 sees you

## 2019-05-02 NOTE — Telephone Encounter (Signed)
Ok to establish as new pt in open 30 min slot. Thank you

## 2019-05-06 NOTE — Telephone Encounter (Signed)
Left message asking pt to call office  °

## 2019-05-06 NOTE — Telephone Encounter (Signed)
appointment 12/1 Pt aware

## 2019-06-03 DIAGNOSIS — F331 Major depressive disorder, recurrent, moderate: Secondary | ICD-10-CM | POA: Diagnosis not present

## 2019-06-04 ENCOUNTER — Ambulatory Visit: Payer: Self-pay | Admitting: Family Medicine

## 2019-06-04 ENCOUNTER — Other Ambulatory Visit: Payer: Self-pay

## 2019-06-04 ENCOUNTER — Encounter: Payer: Self-pay | Admitting: Family Medicine

## 2019-06-04 VITALS — BP 160/110 | HR 95 | Temp 98.1°F | Ht 70.0 in | Wt 266.0 lb

## 2019-06-04 DIAGNOSIS — Z23 Encounter for immunization: Secondary | ICD-10-CM | POA: Diagnosis not present

## 2019-06-04 DIAGNOSIS — D649 Anemia, unspecified: Secondary | ICD-10-CM

## 2019-06-04 DIAGNOSIS — Z0000 Encounter for general adult medical examination without abnormal findings: Secondary | ICD-10-CM

## 2019-06-04 DIAGNOSIS — E559 Vitamin D deficiency, unspecified: Secondary | ICD-10-CM | POA: Diagnosis not present

## 2019-06-04 DIAGNOSIS — Z8611 Personal history of tuberculosis: Secondary | ICD-10-CM | POA: Diagnosis not present

## 2019-06-04 DIAGNOSIS — R030 Elevated blood-pressure reading, without diagnosis of hypertension: Secondary | ICD-10-CM

## 2019-06-04 DIAGNOSIS — Z0001 Encounter for general adult medical examination with abnormal findings: Secondary | ICD-10-CM | POA: Insufficient documentation

## 2019-06-04 DIAGNOSIS — F329 Major depressive disorder, single episode, unspecified: Secondary | ICD-10-CM

## 2019-06-04 DIAGNOSIS — M25471 Effusion, right ankle: Secondary | ICD-10-CM | POA: Insufficient documentation

## 2019-06-04 DIAGNOSIS — R7303 Prediabetes: Secondary | ICD-10-CM | POA: Insufficient documentation

## 2019-06-04 DIAGNOSIS — I10 Essential (primary) hypertension: Secondary | ICD-10-CM | POA: Insufficient documentation

## 2019-06-04 DIAGNOSIS — F32A Depression, unspecified: Secondary | ICD-10-CM | POA: Insufficient documentation

## 2019-06-04 LAB — HEMOGLOBIN A1C: Hgb A1c MFr Bld: 6.3 % (ref 4.6–6.5)

## 2019-06-04 LAB — COMPREHENSIVE METABOLIC PANEL
ALT: 14 U/L (ref 0–35)
AST: 15 U/L (ref 0–37)
Albumin: 3.7 g/dL (ref 3.5–5.2)
Alkaline Phosphatase: 51 U/L (ref 39–117)
BUN: 16 mg/dL (ref 6–23)
CO2: 26 mEq/L (ref 19–32)
Calcium: 9 mg/dL (ref 8.4–10.5)
Chloride: 104 mEq/L (ref 96–112)
Creatinine, Ser: 0.92 mg/dL (ref 0.40–1.20)
GFR: 82.31 mL/min (ref >60.00)
Glucose, Bld: 94 mg/dL (ref 70–99)
Potassium: 4.7 mEq/L (ref 3.5–5.1)
Sodium: 137 mEq/L (ref 135–145)
Total Bilirubin: 0.4 mg/dL (ref 0.2–1.2)
Total Protein: 6.8 g/dL (ref 6.0–8.3)

## 2019-06-04 LAB — CBC WITH DIFFERENTIAL/PLATELET
Basophils Absolute: 0 10*3/uL (ref 0.0–0.1)
Basophils Relative: 0.5 % (ref 0.0–3.0)
Eosinophils Absolute: 0.1 10*3/uL (ref 0.0–0.7)
Eosinophils Relative: 1.4 % (ref 0.0–5.0)
HCT: 37.6 % (ref 36.0–46.0)
Hemoglobin: 12.2 g/dL (ref 12.0–15.0)
Lymphocytes Relative: 28 % (ref 12.0–46.0)
Lymphs Abs: 2.7 10*3/uL (ref 0.7–4.0)
MCHC: 32.4 g/dL (ref 30.0–36.0)
MCV: 79.6 fl (ref 78.0–100.0)
Monocytes Absolute: 0.5 10*3/uL (ref 0.1–1.0)
Monocytes Relative: 5.3 % (ref 3.0–12.0)
Neutro Abs: 6.2 10*3/uL (ref 1.4–7.7)
Neutrophils Relative %: 64.8 % (ref 43.0–77.0)
Platelets: 326 10*3/uL (ref 150.0–400.0)
RBC: 4.72 Mil/uL (ref 3.87–5.11)
RDW: 12.8 % (ref 11.5–15.5)
WBC: 9.6 10*3/uL (ref 4.0–10.5)

## 2019-06-04 LAB — LIPID PANEL
Cholesterol: 151 mg/dL (ref 0–200)
HDL: 44.3 mg/dL (ref >39.00)
LDL Cholesterol: 73 mg/dL (ref 0–99)
NonHDL: 107.14
Total CHOL/HDL Ratio: 3
Triglycerides: 173 mg/dL — ABNORMAL HIGH (ref 0.0–149.0)
VLDL: 34.6 mg/dL (ref 0.0–40.0)

## 2019-06-04 LAB — VITAMIN D 25 HYDROXY (VIT D DEFICIENCY, FRACTURES): VITD: 18.42 ng/mL — ABNORMAL LOW (ref 30.00–100.00)

## 2019-06-04 LAB — TSH: TSH: 1.96 u[IU]/mL (ref 0.35–4.50)

## 2019-06-04 NOTE — Progress Notes (Signed)
This visit was conducted in person.  BP (!) 160/110 (BP Location: Right Arm, Cuff Size: Large)   Pulse 95   Temp 98.1 F (36.7 C) (Temporal)   Ht 5\' 10"  (1.778 m)   Wt 266 lb (120.7 kg)   LMP 05/20/2019   SpO2 98%   BMI 38.17 kg/m   BP Readings from Last 3 Encounters:  06/04/19 (!) 160/110  01/05/16 135/79  06/07/15 128/74    CC: new pt to establish care Subjective:    Patient ID: Laura Calderon, female    DOB: 05/01/1981, 38 y.o.   MRN: [id number]  HPI: Laura Hancockiffany D Calderon is a 38 y.o. female presenting on 06/04/2019 for New Patient (Initial Visit)   I see patient's mother and grandmother.  States BP stays elevated especially when visiting doctor's offices.   Told had prediabetes 1 year ago, weight gain noted since then. Currently intermittent fasting.   H/o depression - sees therapist regularly. Not on meds. Has been recommended L-thiamine. Considering melatonin as well. [place] in [place].   Obesity - currently pursuing intermittent fasting. Trouble with overeating and emotional eating   Some recurrent intermittent R ankle swelling worse when prolonged on her feet. Denies inciting trauma/injury or h/o recurrent ankle injury in the past.   [facility]ative: [facility] Daniela Paul CNM [phone](2019). Normal paps in the past.  Flu shot yearly Tdap 2013 Seat belt use discussed Sunscreen use discussed. 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 Interim medical history since our last visit reviewed. Allergies and medications reviewed and updated. Outpatient  Medications Prior to Visit  Medication Sig Dispense Refill  . ibuprofen (ADVIL) 200 MG tablet Take 200 mg by mouth every 6 (six) hours as needed.    . norgestimate-ethinyl estradiol (ORTHO-CYCLEN) 0.25-35 MG-MCG tablet Take 1 tablet by mouth daily.    . norgestimate-ethinyl estradiol (SPRINTEC 28) 0.25-35 MG-MCG tablet Take 1 tablet by mouth daily.    . cholecalciferol (VITAMIN D) 1000 units tablet Take 2,000 Units by mouth daily.    . ferrous sulfate 325 (65 FE) MG tablet Take 325 mg by mouth 3 (three) times a week.    Marland Kitchen. ibuprofen (ADVIL,MOTRIN) 600 MG tablet Take 1 tablet (600 mg total) by mouth every 6 (six) hours. 30 tablet 0  . norethindrone (ORTHO MICRONOR) 0.35 MG tablet Take 1 tablet (0.35 mg total) by mouth daily. 1 Package 11  . oxyCODONE-acetaminophen (PERCOCET/ROXICET) 5-325 MG tablet Take 1 tablet by mouth every 4 (four) hours as needed (for pain scale greater than or equal to 4 and less than 7). 20 tablet 0  . Prenatal Vit-Fe Fumarate-FA (PRENATAL MULTIVITAMIN) TABS tablet Take 1 tablet by mouth daily.      No facility-administered medications prior to visit.      Per HPI unless specifically indicated in ROS section below Review of Systems  Constitutional: Negative for activity change, appetite change, chills, fatigue, fever and unexpected weight change.  HENT: Negative for hearing loss.   Eyes: Negative for visual disturbance.  Respiratory: Negative for cough, chest tightness, shortness of breath  and wheezing.   Cardiovascular: Negative for chest pain, palpitations and leg swelling.  Gastrointestinal: Negative for abdominal distention, abdominal pain, blood in stool, constipation, diarrhea, nausea and vomiting.  Genitourinary: Negative for difficulty urinating and hematuria.  Musculoskeletal: Negative for arthralgias, myalgias and neck pain.  Skin: Negative for rash.  Neurological: Positive for headaches (h/o migraines - prior on imitrex but now better only on excedrin,  previously worse around cycles). Negative for dizziness, seizures and syncope.  Hematological: Negative for adenopathy. Does not bruise/bleed easily.  Psychiatric/Behavioral: Negative for dysphoric mood. The patient is not nervous/anxious.    Objective:    BP (!) 160/110 (BP Location: Right Arm, Cuff Size: Large)   Pulse 95   Temp 98.1 F (36.7 C) (Temporal)   Ht 5\' 10"  (1.778 m)   Wt 266 lb (120.7 kg)   LMP 05/20/2019   SpO2 98%   BMI 38.17 kg/m   Wt Readings from Last 3 Encounters:  06/04/19 266 lb (120.7 kg)  01/03/16 274 lb (124.3 kg)  06/07/15 243 lb (110.2 kg)    Physical Exam Vitals signs and nursing note reviewed.  Constitutional:      General: She is not in acute distress.    Appearance: Normal appearance. She is well-developed. She is obese. She is not ill-appearing.  HENT:     Head: Normocephalic and atraumatic.     Right Ear: Hearing, tympanic membrane, ear canal and external ear normal.     Left Ear: Hearing, tympanic membrane, ear canal and external ear normal.     Nose: Nose normal.     Mouth/Throat:     Mouth: Mucous membranes are moist.     Pharynx: Oropharynx is clear. Uvula midline. No posterior oropharyngeal erythema.  Eyes:     General: No scleral icterus.    Extraocular Movements: Extraocular movements intact.     Conjunctiva/sclera: Conjunctivae normal.     Pupils: Pupils are equal, round, and reactive to light.  Neck:     Musculoskeletal: Normal range of motion and neck supple.  Cardiovascular:     Rate and Rhythm: Normal rate and regular rhythm.     Pulses: Normal pulses.          Radial pulses are 2+ on the right side and 2+ on the left side.     Heart sounds: Normal heart sounds. No murmur.  Pulmonary:     Effort: Pulmonary effort is normal. No respiratory distress.     Breath sounds: Normal breath sounds. No wheezing, rhonchi or rales.  Abdominal:     General: Abdomen is flat. Bowel sounds are normal. There is no distension.      Palpations: Abdomen is soft. There is no mass.     Tenderness: There is no abdominal tenderness. There is no guarding or rebound.     Hernia: No hernia is present.  Musculoskeletal: Normal range of motion.     Right lower leg: No edema.     Left lower leg: No edema.  Lymphadenopathy:     Cervical: No cervical adenopathy.  Skin:    General: Skin is warm and dry.     Findings: No rash.  Neurological:     General: No focal deficit present.     Mental Status: She is alert and oriented to person, place, and time.     Comments: CN grossly intact, station and gait intact  Psychiatric:        Mood and Affect: Mood normal.  Behavior: Behavior normal.        Thought Content: Thought content normal.        Judgment: Judgment normal.       Results for orders placed or performed during the hospital encounter of 01/03/16  OB RESULT CONSOLE Group B Strep  Result Value Ref Range   GBS Positive   HIV antibody (routine testing) (NOT for [facility])  Result Value Ref Range   HIV Screen 4th Generation wRfx Non Reactive Non Reactive  OB RESULTS CONSOLE GC/Chlamydia  Result Value Ref Range   Gonorrhea Negative    Chlamydia Negative   OB RESULTS CONSOLE RPR  Result Value Ref Range   RPR Nonreactive   OB RESULTS CONSOLE HIV antibody  Result Value Ref Range   HIV Non-reactive   OB RESULTS CONSOLE Rubella Antibody  Result Value Ref Range   Rubella Immune   OB RESULTS CONSOLE Hepatitis B surface antigen  Result Value Ref Range   Hepatitis B Surface Ag Negative   CBC  Result Value Ref Range   WBC 10.3 4.0 - 10.5 K/uL   RBC 4.58 3.87 - 5.11 MIL/uL   Hemoglobin 12.2 12.0 - 15.0 g/dL   HCT 62.1 30.8 - 65.7 %   MCV 78.8 78.0 - 100.0 fL   MCH 26.6 26.0 - 34.0 pg   MCHC 33.8 30.0 - 36.0 g/dL   RDW 84.6 96.2 - 95.2 %   Platelets 231 150 - 400 K/uL  RPR  Result Value Ref Range   RPR Ser Ql Non Reactive Non Reactive  CBC  Result Value Ref Range   WBC 21.6 (H) 4.0 - 10.5 K/uL   RBC 4.23 3.87  - 5.11 MIL/uL   Hemoglobin 11.2 (L) 12.0 - 15.0 g/dL   HCT 84.1 (L) 32.4 - 40.1 %   MCV 79.0 78.0 - 100.0 fL   MCH 26.5 26.0 - 34.0 pg   MCHC 33.5 30.0 - 36.0 g/dL   RDW 02.7 25.3 - 66.4 %   Platelets 234 150 - 400 K/uL  Hemoglobin and hematocrit, blood  Result Value Ref Range   Hemoglobin 9.7 (L) 12.0 - 15.0 g/dL   HCT 40.3 (L) 47.4 - 25.9 %  OB RESULTS CONSOLE ABO/Rh  Result Value Ref Range   RH Type  Positive    ABO Grouping O   OB RESULTS CONSOLE Antibody Screen  Result Value Ref Range   Antibody Screen Negative   Type and screen  Result Value Ref Range   ABO/RH(D) O POS    Antibody Screen NEG    Sample Expiration 01/06/2016   ABO/Rh  Result Value Ref Range   ABO/RH(D) O POS    Assessment & Plan:  This visit occurred during the SARS-CoV-2 public health emergency.  Safety protocols were in place, including screening questions prior to the visit, additional usage of staff PPE, and extensive cleaning of exam room while observing appropriate contact time as indicated for disinfecting solutions.   Problem List Items Addressed This Visit    Severe obesity (BMI 35.0-39.9) with comorbidity (HCC)    Encouraged healthy diet and lifestyle changes to affect sustainable weight loss. She is motivated to continue intermittent fasting as well as restart zumba videos. Reviewed relation of weight on blood pressure and glycemic control.       Relevant Orders   Lipid panel   Comprehensive metabolic panel   TSH   Hemoglobin A1c   Right ankle swelling    Recurrent R ankle swelling that can  be painful - denies prior injury. Not consistent with gout. Anticipate recurrent flares of prior unknown injury (?recurrent ankle sprains remotely). Suggested ankle brace PRN increased activity.       Prediabetes    Update A1c.       History of treatment for tuberculosis    H/o this, completed 6 months of TB treatment remotely       Health maintenance examination - Primary    Preventative  protocols reviewed and updated unless pt declined. Discussed healthy diet and lifestyle.       Elevated blood pressure reading without diagnosis of hypertension    Markedly elevated BP noted today, anticipate white coat HTN contributing as well as recent sodium rich diet around Thanksgiving. Reviewed optimal strategy to check BP at home, advised she start monitoring. Provided with DASH diet, rec 150 min mod intensity aerobic exercise. RTC 1-2 mo f/u visit, but update me sooner if BP staying high to discuss antihypertensive. Pt agrees with plan.       Depression    H/o this, sees therapist. Declines medication at this time. Encouraged healthy stress relieving strategies.       Anemia    Update CBC.      Relevant Orders   CBC with Differential    Other Visit Diagnoses    Need for influenza vaccination       Relevant Orders   Flu Vaccine QUAD 36+ mos IM (Completed)   Vitamin D deficiency       Relevant Orders   vit d       No orders of the defined types were placed in this encounter.  Orders Placed This Encounter  Procedures  . Flu Vaccine QUAD 36+ mos IM  . Lipid panel  . Comprehensive metabolic panel  . TSH  . Hemoglobin A1c  . CBC with Differential  . vit d    Patient instructions: Flu shot today Labs today.  Blood pressure is staying high today. Start monitoring at home, let me know readings especially if staying >140/90.  Return in 1-2 months for BP follow up visit.   Your goal blood pressure is <140/90, ideally lower if able. Work on low salt/sodium diet - goal <1.5gm (1,500mg ) per day. Eat a diet high in fruits/vegetables and whole grains.  Look into mediterranean and DASH diet. Goal activity is 111min/wk of moderate intensity exercise.  This can be split into 30 minute chunks.  If you are not at this level, you can start with smaller 10-15 min increments and slowly build up activity. Look at www.heart.org for more resources   Follow up plan: Return in about  2 months (around 08/05/2019) for follow up visit.  Eustaquio Boyden, MD

## 2019-06-04 NOTE — Patient Instructions (Addendum)
Flu shot today Labs today.  Blood pressure is staying high today. Start monitoring at home, let me know readings especially if staying >140/90.  Return in 1-2 months for BP follow up visit.   Your goal blood pressure is <140/90, ideally lower if able. Work on low salt/sodium diet - goal <1.5gm (1,500mg ) per day. Eat a diet high in fruits/vegetables and whole grains.  Look into mediterranean and DASH diet. Goal activity is 181min/wk of moderate intensity exercise.  This can be split into 30 minute chunks.  If you are not at this level, you can start with smaller 10-15 min increments and slowly build up activity. Look at www.heart.org for more resources   DASH Eating Plan DASH stands for "Dietary Approaches to Stop Hypertension." The DASH eating plan is a healthy eating plan that has been shown to reduce high blood pressure (hypertension). It may also reduce your risk for type 2 diabetes, heart disease, and stroke. The DASH eating plan may also help with weight loss. What are tips for following this plan?  General guidelines  Avoid eating more than 2,300 mg (milligrams) of salt (sodium) a day. If you have hypertension, you may need to reduce your sodium intake to 1,500 mg a day.  Limit alcohol intake to no more than 1 drink a day for nonpregnant women and 2 drinks a day for men. One drink equals 12 oz of beer, 5 oz of wine, or 1 oz of hard liquor.  Work with your health care provider to maintain a healthy body weight or to lose weight. Ask what an ideal weight is for you.  Get at least 30 minutes of exercise that causes your heart to beat faster (aerobic exercise) most days of the week. Activities may include walking, swimming, or biking.  Work with your health care provider or diet and nutrition specialist (dietitian) to adjust your eating plan to your individual calorie needs. Reading food labels   Check food labels for the amount of sodium per serving. Choose foods with less than 5  percent of the Daily Value of sodium. Generally, foods with less than 300 mg of sodium per serving fit into this eating plan.  To find whole grains, look for the word "whole" as the first word in the ingredient list. Shopping  Buy products labeled as "low-sodium" or "no salt added."  Buy fresh foods. Avoid canned foods and premade or frozen meals. Cooking  Avoid adding salt when cooking. Use salt-free seasonings or herbs instead of table salt or sea salt. Check with your health care provider or pharmacist before using salt substitutes.  Do not fry foods. Cook foods using healthy methods such as baking, boiling, grilling, and broiling instead.  Cook with heart-healthy oils, such as olive, canola, soybean, or sunflower oil. Meal planning  Eat a balanced diet that includes: ? 5 or more servings of fruits and vegetables each day. At each meal, try to fill half of your plate with fruits and vegetables. ? Up to 6-8 servings of whole grains each day. ? Less than 6 oz of lean meat, poultry, or fish each day. A 3-oz serving of meat is about the same size as a deck of cards. One egg equals 1 oz. ? 2 servings of low-fat dairy each day. ? A serving of nuts, seeds, or beans 5 times each week. ? Heart-healthy fats. Healthy fats called Omega-3 fatty acids are found in foods such as flaxseeds and coldwater fish, like sardines, salmon, and mackerel.  Limit  how much you eat of the following: ? Canned or prepackaged foods. ? Food that is high in trans fat, such as fried foods. ? Food that is high in saturated fat, such as fatty meat. ? Sweets, desserts, sugary drinks, and other foods with added sugar. ? Full-fat dairy products.  Do not salt foods before eating.  Try to eat at least 2 vegetarian meals each week.  Eat more home-cooked food and less restaurant, buffet, and fast food.  When eating at a restaurant, ask that your food be prepared with less salt or no salt, if possible. What foods are  recommended? The items listed may not be a complete list. Talk with your dietitian about what dietary choices are best for you. Grains Whole-grain or whole-wheat bread. Whole-grain or whole-wheat pasta. Brown rice. [place]. Bulgur. Whole-grain and low-sodium cereals. Pita bread. Low-fat, low-sodium crackers. Whole-wheat flour tortillas. Vegetables Fresh or frozen vegetables (raw, steamed, roasted, or grilled). Low-sodium or reduced-sodium tomato and vegetable juice. Low-sodium or reduced-sodium tomato sauce and tomato paste. Low-sodium or reduced-sodium canned vegetables. Fruits All fresh, dried, or frozen fruit. Canned fruit in natural juice (without added sugar). Meat and other protein foods Skinless chicken or [place]. Ground chicken or [place]. Pork with fat trimmed off. Fish and seafood. Egg whites. Dried beans, peas, or lentils. Unsalted nuts, nut butters, and seeds. Unsalted canned beans. Lean cuts of beef with fat trimmed off. Low-sodium, lean deli meat. Dairy Low-fat (1%) or fat-free (skim) milk. Fat-free, low-fat, or reduced-fat cheeses. Nonfat, low-sodium ricotta or cottage cheese. Low-fat or nonfat yogurt. Low-fat, low-sodium cheese. Fats and oils Soft margarine without trans fats. Vegetable oil. Low-fat, reduced-fat, or light mayonnaise and salad dressings (reduced-sodium). Canola, safflower, olive, soybean, and sunflower oils. Avocado. Seasoning and other foods Herbs. Spices. Seasoning mixes without salt. Unsalted popcorn and pretzels. Fat-free sweets. What foods are not recommended? The items listed may not be a complete list. Talk with your dietitian about what dietary choices are best for you. Grains Baked goods made with fat, such as croissants, muffins, or some breads. Dry pasta or rice meal packs. Vegetables Creamed or fried vegetables. Vegetables in a cheese sauce. Regular canned vegetables (not low-sodium or reduced-sodium). Regular canned tomato sauce and paste (not  low-sodium or reduced-sodium). Regular tomato and vegetable juice (not low-sodium or reduced-sodium). Angie Fava. Olives. Fruits Canned fruit in a light or heavy syrup. Fried fruit. Fruit in cream or butter sauce. Meat and other protein foods Fatty cuts of meat. Ribs. Fried meat. Berniece Salines. Sausage. Bologna and other processed lunch meats. Salami. Fatback. Hotdogs. Bratwurst. Salted nuts and seeds. Canned beans with added salt. Canned or smoked fish. Whole eggs or egg yolks. Chicken or [place] with skin. Dairy Whole or 2% milk, cream, and half-and-half. Whole or full-fat cream cheese. Whole-fat or sweetened yogurt. Full-fat cheese. Nondairy creamers. Whipped toppings. Processed cheese and cheese spreads. Fats and oils Butter. Stick margarine. Lard. Shortening. Ghee. Bacon fat. Tropical oils, such as coconut, palm kernel, or palm oil. Seasoning and other foods Salted popcorn and pretzels. Onion salt, garlic salt, seasoned salt, table salt, and sea salt. Worcestershire sauce. Tartar sauce. Barbecue sauce. Teriyaki sauce. Soy sauce, including reduced-sodium. Steak sauce. Canned and packaged gravies. Fish sauce. Oyster sauce. Cocktail sauce. Horseradish that you find on the shelf. Ketchup. Mustard. Meat flavorings and tenderizers. Bouillon cubes. Hot sauce and Tabasco sauce. Premade or packaged marinades. Premade or packaged taco seasonings. Relishes. Regular salad dressings. Where to find more information:  National Heart, Lung, and Blood Institute:  PopSteam.iswww.nhlbi.nih.gov  American Heart Association: www.heart.org Summary  The DASH eating plan is a healthy eating plan that has been shown to reduce high blood pressure (hypertension). It may also reduce your risk for type 2 diabetes, heart disease, and stroke.  With the DASH eating plan, you should limit salt (sodium) intake to 2,300 mg a day. If you have hypertension, you may need to reduce your sodium intake to 1,500 mg a day.  When on the DASH eating plan,  aim to eat more fresh fruits and vegetables, whole grains, lean proteins, low-fat dairy, and heart-healthy fats.  Work with your health care provider or diet and nutrition specialist (dietitian) to adjust your eating plan to your individual calorie needs. This information is not intended to replace advice given to you by your health care provider. Make sure you discuss any questions you have with your health care provider. Document Released: 06/09/2011 Document Revised: 06/02/2017 Document Reviewed: 06/13/2016 Elsevier Patient Education  2020 [employer]   Health Maintenance, Female Adopting a healthy lifestyle and getting preventive care are important in promoting health and wellness. Ask your health care provider about:  The right schedule for you to have regular tests and exams.  Things you can do on your own to prevent diseases and keep yourself healthy. What should I know about diet, weight, and exercise? Eat a healthy diet   Eat a diet that includes plenty of vegetables, fruits, low-fat dairy products, and lean protein.  Do not eat a lot of foods that are high in solid fats, added sugars, or sodium. Maintain a healthy weight Body mass index (BMI) is used to identify weight problems. It estimates body fat based on height and weight. Your health care provider can help determine your BMI and help you achieve or maintain a healthy weight. Get regular exercise Get regular exercise. This is one of the most important things you can do for your health. Most adults should:  Exercise for at least 150 minutes each week. The exercise should increase your heart rate and make you sweat (moderate-intensity exercise).  Do strengthening exercises at least twice a week. This is in addition to the moderate-intensity exercise.  Spend less time sitting. Even light physical activity can be beneficial. Watch cholesterol and blood lipids Have your blood tested for lipids and cholesterol at 38 years  of age, then have this test every 5 years. Have your cholesterol levels checked more often if:  Your lipid or cholesterol levels are high.  You are older than 38 years of age.  You are at high risk for heart disease. What should I know about cancer screening? Depending on your health history and family history, you may need to have cancer screening at various ages. This may include screening for:  Breast cancer.  Cervical cancer.  Colorectal cancer.  Skin cancer.  Lung cancer. What should I know about heart disease, diabetes, and high blood pressure? Blood pressure and heart disease  High blood pressure causes heart disease and increases the risk of stroke. This is more likely to develop in people who have high blood pressure readings, are of African descent, or are overweight.  Have your blood pressure checked: ? Every 3-5 years if you are 1518-38 years of age. ? Every year if you are 38 years old or older. Diabetes Have regular diabetes screenings. This checks your fasting blood sugar level. Have the screening done:  Once every three years after age 38 if you are at a normal weight and  have a low risk for diabetes.  More often and at a younger age if you are overweight or have a high risk for diabetes. What should I know about preventing infection? Hepatitis B If you have a higher risk for hepatitis B, you should be screened for this virus. Talk with your health care provider to find out if you are at risk for hepatitis B infection. Hepatitis C Testing is recommended for:  Everyone born from [birth year] through 1965.  Anyone with known risk factors for hepatitis C. Sexually transmitted infections (STIs)  Get screened for STIs, including gonorrhea and chlamydia, if: ? You are sexually active and are younger than 38 years of age. ? You are older than 38 years of age and your health care provider tells you that you are at risk for this type of infection. ? Your sexual activity  has changed since you were last screened, and you are at increased risk for chlamydia or gonorrhea. Ask your health care provider if you are at risk.  Ask your health care provider about whether you are at high risk for HIV. Your health care provider may recommend a prescription medicine to help prevent HIV infection. If you choose to take medicine to prevent HIV, you should first get tested for HIV. You should then be tested every 3 months for as long as you are taking the medicine. Pregnancy  If you are about to stop having your period (premenopausal) and you may become pregnant, seek counseling before you get pregnant.  Take 400 to 800 micrograms (mcg) of folic acid every day if you become pregnant.  Ask for birth control (contraception) if you want to prevent pregnancy. Osteoporosis and menopause Osteoporosis is a disease in which the bones lose minerals and strength with aging. This can result in bone fractures. If you are 96 years old or older, or if you are at risk for osteoporosis and fractures, ask your health care provider if you should:  Be screened for bone loss.  Take a calcium or vitamin D supplement to lower your risk of fractures.  Be given hormone replacement therapy (HRT) to treat symptoms of menopause. Follow these instructions at home: Lifestyle  Do not use any products that contain nicotine or tobacco, such as cigarettes, e-cigarettes, and chewing tobacco. If you need help quitting, ask your health care provider.  Do not use street drugs.  Do not share needles.  Ask your health care provider for help if you need support or information about quitting drugs. Alcohol use  Do not drink alcohol if: ? Your health care provider tells you not to drink. ? You are pregnant, may be pregnant, or are planning to become pregnant.  If you drink alcohol: ? Limit how much you use to 0-1 drink a day. ? Limit intake if you are breastfeeding.  Be aware of how much alcohol is in  your drink. In the [place] one drink equals one 12 oz bottle of beer (355 mL), one 5 oz glass of wine (148 mL), or one 1 oz glass of hard liquor (44 mL). General instructions  Schedule regular health, dental, and eye exams.  Stay current with your vaccines.  Tell your health care provider if: ? You often feel depressed. ? You have ever been abused or do not feel safe at home. Summary  Adopting a healthy lifestyle and getting preventive care are important in promoting health and wellness.  Follow your health care provider's instructions about healthy diet, exercising, and getting  tested or screened for diseases.  Follow your health care provider's instructions on monitoring your cholesterol and blood pressure. This information is not intended to replace advice given to you by your health care provider. Make sure you discuss any questions you have with your health care provider. Document Released: 01/03/2011 Document Revised: 06/13/2018 Document Reviewed: 06/13/2018 Elsevier Patient Education  2020 [employer].

## 2019-06-04 NOTE — Assessment & Plan Note (Addendum)
H/o this, completed 6 months of TB treatment remotely

## 2019-06-04 NOTE — Assessment & Plan Note (Signed)
Recurrent R ankle swelling that can be painful - denies prior injury. Not consistent with gout. Anticipate recurrent flares of prior unknown injury (?recurrent ankle sprains remotely). Suggested ankle brace PRN increased activity.

## 2019-06-04 NOTE — Assessment & Plan Note (Signed)
Update CBC. 

## 2019-06-04 NOTE — Assessment & Plan Note (Signed)
Markedly elevated BP noted today, anticipate white coat HTN contributing as well as recent sodium rich diet around Thanksgiving. Reviewed optimal strategy to check BP at home, advised she start monitoring. Provided with DASH diet, rec 150 min mod intensity aerobic exercise. RTC 1-2 mo f/u visit, but update me sooner if BP staying high to discuss antihypertensive. Pt agrees with plan.

## 2019-06-04 NOTE — Assessment & Plan Note (Signed)
Encouraged healthy diet and lifestyle changes to affect sustainable weight loss. She is motivated to continue intermittent fasting as well as restart zumba videos. Reviewed relation of weight on blood pressure and glycemic control.

## 2019-06-04 NOTE — Assessment & Plan Note (Signed)
H/o this, sees therapist. Declines medication at this time. Encouraged healthy stress relieving strategies.

## 2019-06-04 NOTE — Assessment & Plan Note (Signed)
Update A1c ?

## 2019-06-04 NOTE — Assessment & Plan Note (Signed)
Preventative protocols reviewed and updated unless pt declined. Discussed healthy diet and lifestyle.  

## 2019-06-07 ENCOUNTER — Encounter: Payer: Self-pay | Admitting: Family Medicine

## 2019-06-07 ENCOUNTER — Other Ambulatory Visit: Payer: Self-pay | Admitting: Family Medicine

## 2019-06-07 DIAGNOSIS — E559 Vitamin D deficiency, unspecified: Secondary | ICD-10-CM | POA: Insufficient documentation

## 2019-06-07 MED ORDER — VITAMIN D 50 MCG (2000 UT) PO CAPS: 1.0000 | ORAL_CAPSULE | Freq: Every day | ORAL | Status: DC

## 2019-06-15 MED ORDER — AMLODIPINE BESYLATE 5 MG PO TABS: 5.0000 mg | ORAL_TABLET | Freq: Every day | ORAL | 3 refills | Status: DC

## 2019-06-15 NOTE — Addendum Note (Signed)
Addended by: Ria Bush on: 06/15/2019 12:39 PM   Modules accepted: Orders

## 2019-07-03 DIAGNOSIS — F331 Major depressive disorder, recurrent, moderate: Secondary | ICD-10-CM | POA: Diagnosis not present

## 2019-07-16 ENCOUNTER — Ambulatory Visit: Payer: Self-pay | Attending: Internal Medicine

## 2019-07-16 DIAGNOSIS — Z20822 Contact with and (suspected) exposure to covid-19: Secondary | ICD-10-CM

## 2019-07-16 DIAGNOSIS — F331 Major depressive disorder, recurrent, moderate: Secondary | ICD-10-CM | POA: Diagnosis not present

## 2019-07-18 LAB — NOVEL CORONAVIRUS, NAA: SARS-CoV-2, NAA: NOT DETECTED

## 2019-07-22 DIAGNOSIS — Z124 Encounter for screening for malignant neoplasm of cervix: Secondary | ICD-10-CM | POA: Diagnosis not present

## 2019-07-22 DIAGNOSIS — Z6837 Body mass index (BMI) 37.0-37.9, adult: Secondary | ICD-10-CM | POA: Diagnosis not present

## 2019-07-22 DIAGNOSIS — Z1151 Encounter for screening for human papillomavirus (HPV): Secondary | ICD-10-CM | POA: Diagnosis not present

## 2019-07-22 DIAGNOSIS — Z01419 Encounter for gynecological examination (general) (routine) without abnormal findings: Secondary | ICD-10-CM | POA: Diagnosis not present

## 2019-07-30 ENCOUNTER — Encounter: Payer: Self-pay | Admitting: Family Medicine

## 2019-08-05 ENCOUNTER — Encounter: Payer: Self-pay | Admitting: Family Medicine

## 2019-08-05 ENCOUNTER — Other Ambulatory Visit: Payer: Self-pay

## 2019-08-05 ENCOUNTER — Ambulatory Visit: Payer: BC Managed Care – PPO | Admitting: Family Medicine

## 2019-08-05 ENCOUNTER — Ambulatory Visit: Payer: BC Managed Care – PPO | Attending: Internal Medicine

## 2019-08-05 VITALS — BP 120/80 | HR 88 | Temp 97.8°F | Wt 262.0 lb

## 2019-08-05 DIAGNOSIS — Z20822 Contact with and (suspected) exposure to covid-19: Secondary | ICD-10-CM

## 2019-08-05 DIAGNOSIS — I10 Essential (primary) hypertension: Secondary | ICD-10-CM | POA: Diagnosis not present

## 2019-08-05 MED ORDER — AMLODIPINE BESYLATE 2.5 MG PO TABS: 2.5000 mg | ORAL_TABLET | Freq: Every day | ORAL | 6 refills | Status: DC

## 2019-08-05 NOTE — Assessment & Plan Note (Signed)
Marked improvement in blood pressures since starting amlodipine, however with endorsed dizziness concern for over-treatment. Will decrease amlodipine to 2.5mg  daily. Update with effect. Orthostatic vital signs checked today.

## 2019-08-05 NOTE — Assessment & Plan Note (Signed)
Motivated to continue healthy diet and regular aerobic activity (cycling) for sustainable weight loss.

## 2019-08-05 NOTE — Progress Notes (Signed)
This visit was conducted in person.  BP 124/78   Pulse 88   Temp 97.8 F (36.6 C) (Temporal)   Wt 262 lb (118.8 kg)   SpO2 98%   BMI 37.59 kg/m   No data found.   CC: 2 mo f/u visit Subjective:    Patient ID: Laura Calderon, female    DOB: Apr 18, 1981, 39 y.o.   MRN: [id number]  HPI: Laura Calderon is a 39 y.o. female presenting on 08/05/2019 for No chief complaint on file.   HTN - Compliant with current antihypertensive regimen of amlodipine 5mg  daily. Notes dizziness since starting med - this may be improving. Does not check blood pressures at home - but she does have BP cuff. Saw OB a few weeks ago - mildly elevated then as well. Denies HA, vision changes, CP/tightness, leg swelling. No ankle swelling. Some dyspnea since December - attributed to weight gain. She really enjoys cycling and enjoys her mobile community for this.   Obesity - healthy diet and lifestyle changes have led to 4 lb weight loss.      Relevant past medical, surgical, family and social history reviewed and updated as indicated. Interim medical history since our last visit reviewed. Allergies and medications reviewed and updated. Outpatient Medications Prior to Visit  Medication Sig Dispense Refill  . Cholecalciferol (VITAMIN D) 50 MCG (2000 UT) CAPS Take 1 capsule (2,000 Units total) by mouth daily. 30 capsule   . ibuprofen (ADVIL) 200 MG tablet Take 200 mg by mouth every 6 (six) hours as needed.    . norgestimate-ethinyl estradiol (SPRINTEC 28) 0.25-35 MG-MCG tablet Take 1 tablet by mouth daily.    Marland Kitchen amLODipine (NORVASC) 5 MG tablet Take 1 tablet (5 mg total) by mouth daily. 30 tablet 3   No facility-administered medications prior to visit.     Per HPI unless specifically indicated in ROS section below Review of Systems Objective:    BP 124/78   Pulse 88   Temp 97.8 F (36.6 C) (Temporal)   Wt 262 lb (118.8 kg)   SpO2 98%   BMI 37.59 kg/m   Wt Readings from Last 3 Encounters:  08/05/19 262  lb (118.8 kg)  06/04/19 266 lb (120.7 kg)  01/03/16 274 lb (124.3 kg)    Physical Exam Vitals and nursing note reviewed.  Constitutional:      Appearance: Normal appearance. She is obese. She is not ill-appearing.  Cardiovascular:     Rate and Rhythm: Normal rate and regular rhythm.     Pulses: Normal pulses.     Heart sounds: Normal heart sounds. No murmur.  Pulmonary:     Effort: Pulmonary effort is normal. No respiratory distress.     Breath sounds: No wheezing, rhonchi or rales.  Neurological:     Mental Status: She is alert.  Psychiatric:        Mood and Affect: Mood normal.        Behavior: Behavior normal.       Results for orders placed or performed in visit on 07/16/19  Novel Coronavirus, NAA ([facility])   Specimen: Nasopharyngeal(NP) swabs in vial transport medium   NASOPHARYNGE  TESTING  Result Value Ref Range   SARS-CoV-2, NAA Not Detected Not Detected   Assessment & Plan:  This visit occurred during the SARS-CoV-2 public health emergency.  Safety protocols were in place, including screening questions prior to the visit, additional usage of staff PPE, and extensive cleaning of exam room while observing appropriate contact  time as indicated for disinfecting solutions.  Orthostatics stable.  Problem List Items Addressed This Visit    Severe obesity (BMI 35.0-39.9) with comorbidity (HCC)    Motivated to continue healthy diet and regular aerobic activity (cycling) for sustainable weight loss.       Hypertension - Primary    Marked improvement in blood pressures since starting amlodipine, however with endorsed dizziness concern for over-treatment. Will decrease amlodipine to 2.5mg  daily. Update with effect. Orthostatic vital signs checked today.       Relevant Medications   amLODipine (NORVASC) 2.5 MG tablet       Meds ordered this encounter  Medications  . amLODipine (NORVASC) 2.5 MG tablet    Sig: Take 1 tablet (2.5 mg total) by mouth daily.    Dispense:  30  tablet    Refill:  6    Note new dose   No orders of the defined types were placed in this encounter.   Follow up plan: No follow-ups on file.  Eustaquio Boyden, MD

## 2019-08-05 NOTE — Telephone Encounter (Signed)
Form filled and in Lisa's box.  

## 2019-08-05 NOTE — Patient Instructions (Signed)
Orthostatic vital signs checked today.  I'd like you to cut amlodipine tablets in half to take 2.5mg  daily. Let me know when running low to send in lower dose assuming BP stays well controlled. Keep an eye on blood pressures at home.

## 2019-08-06 DIAGNOSIS — F331 Major depressive disorder, recurrent, moderate: Secondary | ICD-10-CM | POA: Diagnosis not present

## 2019-08-06 LAB — NOVEL CORONAVIRUS, NAA: SARS-CoV-2, NAA: NOT DETECTED

## 2019-08-06 NOTE — Telephone Encounter (Signed)
Mailed original to pt.  Notified pt via MyChart.  Made copy to be scanned.

## 2019-08-20 DIAGNOSIS — F331 Major depressive disorder, recurrent, moderate: Secondary | ICD-10-CM | POA: Diagnosis not present

## 2019-10-14 ENCOUNTER — Other Ambulatory Visit: Payer: Self-pay | Admitting: Family Medicine

## 2019-11-01 ENCOUNTER — Encounter: Payer: Self-pay | Admitting: Family Medicine

## 2019-11-01 ENCOUNTER — Ambulatory Visit: Payer: BC Managed Care – PPO | Admitting: Family Medicine

## 2019-11-01 ENCOUNTER — Ambulatory Visit (INDEPENDENT_AMBULATORY_CARE_PROVIDER_SITE_OTHER)
Admission: RE | Admit: 2019-11-01 | Discharge: 2019-11-01 | Disposition: A | Discharge status: Home / Self Care | Payer: BC Managed Care – PPO | Source: Ambulatory Visit | Attending: Family Medicine | Admitting: Family Medicine

## 2019-11-01 ENCOUNTER — Other Ambulatory Visit: Payer: Self-pay

## 2019-11-01 VITALS — BP 160/102 | HR 83 | Temp 97.9°F | Ht 70.0 in | Wt 255.1 lb

## 2019-11-01 DIAGNOSIS — M25511 Pain in right shoulder: Secondary | ICD-10-CM | POA: Diagnosis not present

## 2019-11-01 DIAGNOSIS — M549 Dorsalgia, unspecified: Secondary | ICD-10-CM | POA: Diagnosis not present

## 2019-11-01 DIAGNOSIS — M5412 Radiculopathy, cervical region: Secondary | ICD-10-CM

## 2019-11-01 DIAGNOSIS — M792 Neuralgia and neuritis, unspecified: Secondary | ICD-10-CM

## 2019-11-01 DIAGNOSIS — I10 Essential (primary) hypertension: Secondary | ICD-10-CM | POA: Diagnosis not present

## 2019-11-01 DIAGNOSIS — M542 Cervicalgia: Secondary | ICD-10-CM | POA: Diagnosis not present

## 2019-11-01 MED ORDER — METHOCARBAMOL 500 MG PO TABS: 500.0000 mg | ORAL_TABLET | Freq: Three times a day (TID) | ORAL | 0 refills | Status: DC | PRN

## 2019-11-01 MED ORDER — KETOROLAC TROMETHAMINE 30 MG/ML IJ SOLN
30.0000 mg | Freq: Once | INTRAMUSCULAR | Status: AC
  Administered 2019-11-01: 30 mg via INTRAMUSCULAR

## 2019-11-01 MED ORDER — PREDNISONE 20 MG PO TABS: ORAL_TABLET | ORAL | 0 refills | Status: DC

## 2019-11-01 NOTE — Assessment & Plan Note (Addendum)
Suspicious for rotator cuff tendonitis.  Endorses h/o R frozen shoulder  Exercises provided today.  Toradol IM today then start prednisone course + robaxin.  Continue heating pad Consider PT pending effect of above. Consider steroid injection into shoulder if ongoing.

## 2019-11-01 NOTE — Patient Instructions (Signed)
Sounds like radicular pain which may be coming from neck.  Neck xrays today.  I'm suspicious for flare up of left shoulder pain as well (rotator cuff tendonitis).  Try exercises provided today. Use pain as your guide for activity.  Toradol anti inflammatory shot today. Start prednisone taper for inflammation.  Also use robaxin muscle relaxant for muscle tightness in the upper back and trapezius muscle.  Depending on how you do, we may refer you to physical therapy or have you see our sports medicine doctor Dr Patsy Lager for further evaluation.

## 2019-11-01 NOTE — Addendum Note (Signed)
Addended by: Nanci Pina on: 11/01/2019 09:06 AM   Modules accepted: Orders

## 2019-11-01 NOTE — Progress Notes (Addendum)
This visit was conducted in person.  BP (!) 160/102 (BP Location: Right Arm, Patient Position: Sitting, Cuff Size: Large)   Pulse 83   Temp 97.9 F (36.6 C) (Temporal)   Ht 5\' 10"  (1.778 m)   Wt 255 lb 2 oz (115.7 kg)   LMP 10/31/2019   SpO2 97%   BMI 36.61 kg/m    CC: upper back pain Subjective:    Patient ID: 11/02/2019, female    DOB: 05-13-1981, 39 y.o.   MRN: [id number]  HPI: Laura Calderon is a 39 y.o. female presenting on 11/01/2019 for Shoulder Pain (C/o posterior right shoulder pain radiating into neck, worsening.  Occasionally has sharp, shooting pain in right arm and tingling in fingers.   Started about 1 wk ago.  H/o frozen shoulder.  Tried Flexeril, ibuprofen and Aleve.  No help.)   1 wk h/o mid upper back pain, 3d ago acutely worse affecting ability to sleep, with radiation to R shoulder. Describes burning pain to R posterior shoulder. Some radiation down posterior arm into all 5 fingers with paresthesias. No left sided symptoms.   No fevers/chills. No noted weakness. Able to open jar with hand.  Denies inciting trauma/injury. Last rode on Sunday.   Tried aleve, ibuprofen up to 600mg  without benefit. Flexeril helped her sleep but didn't help pain. Heat seems to help.  H/o frozen shoulder to R arm 2018 - with residual R shoulder ache. Has not had PT for this. This feels different.   HTN - has not been taking bp med the past 2 weeks - will restart.  Working on weight loss - regularly riding peloton 4x/wk since 08/2019.   LMP - this past week.      Relevant past medical, surgical, family and social history reviewed and updated as indicated. Interim medical history since our last visit reviewed. Allergies and medications reviewed and updated. Outpatient Medications Prior to Visit  Medication Sig Dispense Refill  . amLODipine (NORVASC) 2.5 MG tablet Take 1 tablet (2.5 mg total) by mouth daily. 30 tablet 6  . Cholecalciferol (VITAMIN D) 50 MCG (2000 UT) CAPS  Take 1 capsule (2,000 Units total) by mouth daily. 30 capsule   . ibuprofen (ADVIL) 200 MG tablet Take 200 mg by mouth every 6 (six) hours as needed.    . norgestimate-ethinyl estradiol (SPRINTEC 28) 0.25-35 MG-MCG tablet Take 1 tablet by mouth daily.     No facility-administered medications prior to visit.     Per HPI unless specifically indicated in ROS section below Review of Systems Objective:  BP (!) 160/102 (BP Location: Right Arm, Patient Position: Sitting, Cuff Size: Large)   Pulse 83   Temp 97.9 F (36.6 C) (Temporal)   Ht 5\' 10"  (1.778 m)   Wt 255 lb 2 oz (115.7 kg)   LMP 10/31/2019   SpO2 97%   BMI 36.61 kg/m   Wt Readings from Last 3 Encounters:  11/01/19 255 lb 2 oz (115.7 kg)  08/05/19 262 lb (118.8 kg)  06/04/19 266 lb (120.7 kg)      Physical Exam Vitals and nursing note reviewed.  Constitutional:      Appearance: Normal appearance. She is not ill-appearing.  Neck:     Vascular: No carotid bruit.  Musculoskeletal:        General: Tenderness present.     Cervical back: Neck supple. Tenderness present. No rigidity.     Comments:  FROM, painful with neck flexion as well as lateral flexion to  right No significant midline cervical or thoracic spine tenderness Point tender to palpation R trapezius mm as well as into R rhomboids, tightness present as well L shoulder WNL R shoulder exam: No deformity of shoulders on inspection. ++ pain with palpation of posterior shoulder at RTC tendons Limited ROM in abduction and forward flexion due to pain. No pain or weakness with testing SITS in ext/int rotation. ++ pain with empty can sign. ++ Speed test. No impingement. No pain with rotation of humeral head in [facility] joint.   Skin:    General: Skin is warm and dry.     Findings: No rash.  Neurological:     Mental Status: She is alert.     Sensory: Sensation is intact.     Motor: Motor function is intact.     Coordination: Coordination is intact.     Comments:   Painful spurling test to right but no radicular pain or symptoms 5/5 BUE strength Grip strength intact Thumb and pinky opposition strength intact Sensation intact  Psychiatric:        Mood and Affect: Mood normal.        Behavior: Behavior normal.        Assessment & Plan:  This visit occurred during the SARS-CoV-2 public health emergency.  Safety protocols were in place, including screening questions prior to the visit, additional usage of staff PPE, and extensive cleaning of exam room while observing appropriate contact time as indicated for disinfecting solutions.   Problem List Items Addressed This Visit    Upper back pain on right side - Primary    Describes upper back pain with radicular pain down R arm as well as paresthesias of hand ?cervical radiculopathy from disc disease. Check cervical films for baseline today.  Toradol IM today then start prednisone course + robaxin. Steroid precautions reviewed.  Update with effect.       Relevant Medications   methocarbamol (ROBAXIN) 500 MG tablet   predniSONE (DELTASONE) 20 MG tablet   Severe obesity (BMI 35.0-39.9) with comorbidity (HCC)    Congratulated on 15 lb weight loss! She is motivated to continue peloton once she is feeling better.       Hypertension    BP elevated - likely due to pain but she had forgotten BP meds for the past 2 wks - will restart and start monitoring more closely at home.       Acute pain of right shoulder    Suspicious for rotator cuff tendonitis.  Endorses h/o R frozen shoulder  Exercises provided today.  Toradol IM today then start prednisone course + robaxin.  Continue heating pad Consider PT pending effect of above. Consider steroid injection into shoulder if ongoing.        Other Visit Diagnoses    Radicular pain in right arm       Relevant Medications   ketorolac (TORADOL) 30 MG/ML injection 30 mg (Completed)   Other Relevant Orders   DG Cervical Spine Complete       Meds ordered  this encounter  Medications  . methocarbamol (ROBAXIN) 500 MG tablet    Sig: Take 1-2 tablets (500-1,000 mg total) by mouth 3 (three) times daily as needed for muscle spasms (sedation precautions).    Dispense:  40 tablet    Refill:  0  . predniSONE (DELTASONE) 20 MG tablet    Sig: Take two tablets daily for 3 days followed by one tablet daily for 4 days    Dispense:  10  tablet    Refill:  0  . ketorolac (TORADOL) 30 MG/ML injection 30 mg   Orders Placed This Encounter  Procedures  . DG Cervical Spine Complete    Standing Status:   Future    Number of Occurrences:   1    Standing Expiration Date:   12/31/2020    Order Specific Question:   Reason for Exam (SYMPTOM  OR DIAGNOSIS REQUIRED)    Answer:   R radicular pain with paresthesias and neck pain    Order Specific Question:   Is patient pregnant?    Answer:   No    Order Specific Question:   Preferred imaging location?    Answer:   [facility]    Order Specific Question:   Radiology Contrast Protocol - do NOT remove file path    Answer:   [email or web address]   Patient Instructions  Sounds like radicular pain which may be coming from neck.  Neck xrays today.  I'm suspicious for flare up of left shoulder pain as well (rotator cuff tendonitis).  Try exercises provided today. Use pain as your guide for activity.  Toradol anti inflammatory shot today. Start prednisone taper for inflammation.  Also use robaxin muscle relaxant for muscle tightness in the upper back and trapezius muscle.  Depending on how you do, we may refer you to physical therapy or have you see our sports medicine doctor Dr Lorelei Pont for further evaluation.     Follow up plan: Return if symptoms worsen or fail to improve.  Ria Bush, MD

## 2019-11-01 NOTE — Assessment & Plan Note (Signed)
Congratulated on 15 lb weight loss! She is motivated to continue peloton once she is feeling better.

## 2019-11-01 NOTE — Assessment & Plan Note (Signed)
BP elevated - likely due to pain but she had forgotten BP meds for the past 2 wks - will restart and start monitoring more closely at home.

## 2019-11-01 NOTE — Assessment & Plan Note (Addendum)
Describes upper back pain with radicular pain down R arm as well as paresthesias of hand ?cervical radiculopathy from disc disease. Check cervical films for baseline today.  Toradol IM today then start prednisone course + robaxin. Steroid precautions reviewed.  Update with effect.

## 2019-11-04 ENCOUNTER — Encounter: Payer: Self-pay | Admitting: Family Medicine

## 2019-11-04 DIAGNOSIS — M549 Dorsalgia, unspecified: Secondary | ICD-10-CM

## 2019-11-04 DIAGNOSIS — M25511 Pain in right shoulder: Secondary | ICD-10-CM

## 2019-11-05 ENCOUNTER — Telehealth: Payer: Self-pay | Admitting: *Deleted

## 2019-11-05 MED ORDER — GABAPENTIN 100 MG PO CAPS: 100.0000 mg | ORAL_CAPSULE | Freq: Two times a day (BID) | ORAL | 1 refills | Status: DC | PRN

## 2019-11-05 MED ORDER — ACETAMINOPHEN-CODEINE #3 300-30 MG PO TABS: 1.0000 | ORAL_TABLET | Freq: Three times a day (TID) | ORAL | 0 refills | Status: DC | PRN

## 2019-11-05 NOTE — Telephone Encounter (Signed)
Gabapentin sent to pharmacy.  plz notify patient.

## 2019-11-05 NOTE — Addendum Note (Signed)
Addended by: Eustaquio Boyden on: 11/05/2019 01:49 PM   Modules accepted: Orders

## 2019-11-05 NOTE — Telephone Encounter (Signed)
Left message on vm per dpr notifying pt rx was sent in. 

## 2019-11-05 NOTE — Telephone Encounter (Signed)
Patient called stating that a prescription for Gabapentin was suppose to be sent to the pharmacy for her. Patient stated that she talked with the pharmacist and they did received the prescription for Tylenol #3, but not the gabapentin. Patient requested that it be sent in and let her know when it has been done. Pharmacy [facility]

## 2019-11-05 NOTE — Telephone Encounter (Signed)
[place] CSRS reviewed.  tylenol #3 sent to pharmacy.

## 2019-11-07 ENCOUNTER — Other Ambulatory Visit: Payer: Self-pay

## 2019-11-07 ENCOUNTER — Encounter: Payer: Self-pay | Admitting: Family Medicine

## 2019-11-07 ENCOUNTER — Ambulatory Visit: Payer: BC Managed Care – PPO | Admitting: Family Medicine

## 2019-11-07 VITALS — BP 158/108 | HR 86 | Temp 98.2°F | Ht 70.0 in | Wt 255.3 lb

## 2019-11-07 DIAGNOSIS — M25511 Pain in right shoulder: Secondary | ICD-10-CM | POA: Diagnosis not present

## 2019-11-07 DIAGNOSIS — M549 Dorsalgia, unspecified: Secondary | ICD-10-CM

## 2019-11-07 MED ORDER — NAPROXEN 500 MG PO TABS: ORAL_TABLET | ORAL | 0 refills | Status: DC

## 2019-11-07 MED ORDER — METHYLPREDNISOLONE ACETATE 40 MG/ML IJ SUSP
40.0000 mg | Freq: Once | INTRAMUSCULAR | Status: AC
  Administered 2019-11-07: 40 mg via INTRAMUSCULAR

## 2019-11-07 NOTE — Progress Notes (Addendum)
This visit was conducted in person.  BP (!) 158/108 (BP Location: Left Arm, Patient Position: Sitting, Cuff Size: Large)   Pulse 86   Temp 98.2 F (36.8 C) (Temporal)   Ht 5\' 10"  (1.778 m)   Wt 255 lb 5 oz (115.8 kg)   LMP 10/31/2019   SpO2 98%   BMI 36.63 kg/m    CC: f/u R shoulder and neck pain  Subjective:    Patient ID: 11/02/2019, female    DOB: 09/05/1980, 39 y.o.   MRN: [id number]  HPI: Laura Calderon is a 39 y.o. female presenting on 11/07/2019 for Follow-up (C/o right shoulder and posterior neck- mid to right side, pain has worsened.  Now having spasms, which started about 2 days ago. )   R handed.   See prior note - seen here last week with 1 wk h/o upper back pain associated with R shoulder pain - thought combination of cervical radicular pain and possible RTC tendonitis. Treated with toradol IM then prednisone course + robaxin. We added tylenol #3 and gabapentin due to ongoing pain - some benefit noted with this. However when she stopped robaxin, muscle spasms acutely worsened.  Shoulder pain seems to be worsening, neck pain staying the same. Paresthesias have improved on prednisone. Notes more trouble opening peanut butter jar. Feeling swelling to R lateral upper arm. Pain can wake her up from sleep. Trouble holding pen to R hand. 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   Cervical films last week showed mild degenerative arthritis changes C3-6.      Relevant past medical, surgical, family and social history reviewed and updated as indicated. Interim medical history since our last visit reviewed. Allergies and medications reviewed and updated. Outpatient Medications Prior to Visit  Medication Sig Dispense Refill  . acetaminophen-codeine (TYLENOL #3) 300-30 MG tablet Take 1 tablet by mouth 3 (three) times daily as needed for moderate pain. 15 tablet 0  . amLODipine (NORVASC) 2.5 MG tablet Take 1 tablet (2.5 mg total) by mouth daily.  30 tablet 6  . Cholecalciferol (VITAMIN D) 50 MCG (2000 UT) CAPS Take 1 capsule (2,000 Units total) by mouth daily. 30 capsule   . gabapentin (NEURONTIN) 100 MG capsule Take 1-2 capsules (100-200 mg total) by mouth 2 (two) times daily as needed (back pain). 90 capsule 1  . ibuprofen (ADVIL) 200 MG tablet Take 200 mg by mouth every 6 (six) hours as needed.    . norgestimate-ethinyl estradiol (SPRINTEC 28) 0.25-35 MG-MCG tablet Take 1 tablet by mouth daily.    . predniSONE (DELTASONE) 20 MG tablet Take two tablets daily for 3 days followed by one tablet daily for 4 days 10 tablet 0  . methocarbamol (ROBAXIN) 500 MG tablet Take 1-2 tablets (500-1,000 mg total) by mouth 3 (three) times daily as needed for muscle spasms (sedation precautions). (Patient not taking: Reported on 11/07/2019) 40 tablet 0   No facility-administered medications prior to visit.     Per HPI unless specifically indicated in ROS section below Review of Systems Objective:  BP (!) 158/108 (BP Location: Left Arm, Patient Position: Sitting, Cuff Size: Large)   Pulse 86   Temp 98.2 F (36.8 C) (Temporal)   Ht 5\' 10"  (1.778 m)   Wt 255 lb 5 oz (115.8 kg)   LMP 10/31/2019   SpO2 98%   BMI 36.63 kg/m   Wt Readings from Last 3 Encounters:  11/07/19 255 lb 5 oz (115.8  kg)  11/01/19 255 lb 2 oz (115.7 kg)  08/05/19 262 lb (118.8 kg)      Physical Exam Vitals and nursing note reviewed.  Constitutional:      General: She is in acute distress (uncomfortable appearing due to R shoulder pain).     Appearance: Normal appearance. She is not ill-appearing.  Musculoskeletal:        General: Swelling and tenderness present.     Cervical back: Neck supple. Tenderness present. No rigidity.     Comments:  No midline cervical spine pain  Marked tightness and spasm to right trapezius mm Marked tenderness to palpation at R posterior shoulder at bursa Full passive ROM bilateral shoulders, limited active ROM due to pain on right RTC and  biceps tendon strength testing overall intact  Skin:    General: Skin is warm and dry.     Findings: No rash.  Neurological:     Mental Status: She is alert.     Sensory: Sensation is intact.     Motor: Weakness present.     Coordination: Coordination is intact.     Comments:  5/5 strength BUE Grip strength largely intact Mildly diminished strength noted upon testing of thumb/pinky opposition and intrinsic hand muscles  Psychiatric:        Mood and Affect: Mood normal.        Behavior: Behavior normal.       DG Cervical Spine Complete CLINICAL DATA:  39 year old female with neck pain radiating into RIGHT arm.  EXAM: CERVICAL SPINE - COMPLETE 4+ VIEW  COMPARISON:  None.  FINDINGS: Straightening of the normal cervical lordosis is noted.  No acute fracture or subluxation.  Mild degenerative disc disease/spondylosis from C3-C6 noted.  No bony foraminal narrowing or focal bony lesions identified.  IMPRESSION: Mild degenerative disc disease/spondylosis from C3-C6. No evidence of acute abnormality.  Electronically Signed   By: Harmon Pier M.D.   On: 11/01/2019 10:18  R shoulder steroid injection: IC obtained and in chart.  Landmarks palpated and area cleaned with EtOH wipe. Ethyl chloride for numbing. Using posterior lateral approach, steroid injection performed today with 1cc depo medrol (40mg ), 4cc 1% lidocaine using 22g 1.5in needle.  Pt tolerated well. Aftercare instructions provided.   Assessment & Plan:  This visit occurred during the SARS-CoV-2 public health emergency.  Safety protocols were in place, including screening questions prior to the visit, additional usage of staff PPE, and extensive cleaning of exam room while observing appropriate contact time as indicated for disinfecting solutions.   Problem List Items Addressed This Visit    Upper back pain on right side    Has developed R cervical radiculopathy with marked spasm/tightness of R trap.  Hopefully  R shoulder steroid injection will provide some relief.  Recommend finish prednisone course, then start naprosyn BID with meals x 1 wk then PRN, continue robaxin and gabapentin, with tylenol #3 PRN breakthrough pain. Update if not improving with treatment, would refer to SM vs ortho. If worsening cervical radiculopathy, consider advanced neck imaging.       Relevant Medications   naproxen (NAPROSYN) 500 MG tablet   Acute pain of right shoulder - Primary    Exam consistent with shoulder bursitis and anticipate RTC tendonitis, not frozen shoulder. After IC obtained, provided R shoulder steroid injection with some immediate subjective benefit noted. Reviewed red flags to seek urgent care.  Anticipate pain from bursitis leading to marked R trap m spasm/tightness  Meds ordered this encounter  Medications  . naproxen (NAPROSYN) 500 MG tablet    Sig: Take one po bid x 1 week then prn pain, take with food    Dispense:  60 tablet    Refill:  0  . methylPREDNISolone acetate (DEPO-MEDROL) injection 40 mg   No orders of the defined types were placed in this encounter.   Patient Instructions  Steroid shot today Finish prednisone course then start naprosyn 500mg  twice daily sent to pharmacy. Continue robaxin three times daily as needed for muscle spasm Continue gabapentin 200-300mg  at night, 100-200mg  during the day as needed.  Use tylenol #3 for breakthrough pain. Let us know if not improved with this.   Follow up plan: No follow-ups on file.  Ria Bush, MD

## 2019-11-07 NOTE — Patient Instructions (Addendum)
Steroid shot today Finish prednisone course then start naprosyn 500mg  twice daily sent to pharmacy. Continue robaxin three times daily as needed for muscle spasm Continue gabapentin 200-300mg  at night, 100-200mg  during the day as needed.  Use tylenol #3 for breakthrough pain. Let know if not improved with this.

## 2019-11-09 NOTE — Assessment & Plan Note (Addendum)
Exam consistent with shoulder bursitis and anticipate RTC tendonitis, not frozen shoulder. After IC obtained, provided R shoulder steroid injection with some immediate subjective benefit noted. Reviewed red flags to seek urgent care.  Anticipate pain from bursitis leading to marked R trap m spasm/tightness

## 2019-11-09 NOTE — Assessment & Plan Note (Signed)
Has developed R cervical radiculopathy with marked spasm/tightness of R trap.  Hopefully R shoulder steroid injection will provide some relief.  Recommend finish prednisone course, then start naprosyn BID with meals x 1 wk then PRN, continue robaxin and gabapentin, with tylenol #3 PRN breakthrough pain. Update if not improving with treatment, would refer to SM vs ortho. If worsening cervical radiculopathy, consider advanced neck imaging.

## 2019-11-13 NOTE — Addendum Note (Signed)
Addended by: Eustaquio Boyden on: 11/13/2019 06:46 PM   Modules accepted: Orders

## 2019-11-13 NOTE — Telephone Encounter (Signed)
Not much improvement after steroid injection to shoulder - will refer to ortho.

## 2019-11-15 MED ORDER — HYDROCODONE-ACETAMINOPHEN 5-325 MG PO TABS: 1.0000 | ORAL_TABLET | Freq: Four times a day (QID) | ORAL | 0 refills | Status: DC | PRN

## 2019-11-15 NOTE — Addendum Note (Signed)
Addended by: Eustaquio Boyden on: 11/15/2019 10:28 AM   Modules accepted: Orders

## 2019-11-25 DIAGNOSIS — M25511 Pain in right shoulder: Secondary | ICD-10-CM | POA: Diagnosis not present

## 2019-11-25 DIAGNOSIS — M4692 Unspecified inflammatory spondylopathy, cervical region: Secondary | ICD-10-CM | POA: Diagnosis not present

## 2019-11-29 ENCOUNTER — Encounter: Payer: Self-pay | Admitting: Family Medicine

## 2019-11-29 DIAGNOSIS — M542 Cervicalgia: Secondary | ICD-10-CM | POA: Diagnosis not present

## 2019-11-30 ENCOUNTER — Other Ambulatory Visit: Payer: Self-pay | Admitting: Family Medicine

## 2019-12-04 DIAGNOSIS — M5412 Radiculopathy, cervical region: Secondary | ICD-10-CM | POA: Diagnosis not present

## 2019-12-04 DIAGNOSIS — M5000 Cervical disc disorder with myelopathy, unspecified cervical region: Secondary | ICD-10-CM | POA: Diagnosis not present

## 2019-12-10 DIAGNOSIS — M5412 Radiculopathy, cervical region: Secondary | ICD-10-CM | POA: Diagnosis not present

## 2019-12-11 DIAGNOSIS — M5000 Cervical disc disorder with myelopathy, unspecified cervical region: Secondary | ICD-10-CM | POA: Diagnosis not present

## 2019-12-11 DIAGNOSIS — M5412 Radiculopathy, cervical region: Secondary | ICD-10-CM | POA: Diagnosis not present

## 2019-12-20 DIAGNOSIS — M5412 Radiculopathy, cervical region: Secondary | ICD-10-CM | POA: Diagnosis not present

## 2019-12-20 DIAGNOSIS — M542 Cervicalgia: Secondary | ICD-10-CM | POA: Diagnosis not present

## 2020-01-14 DIAGNOSIS — F331 Major depressive disorder, recurrent, moderate: Secondary | ICD-10-CM | POA: Diagnosis not present

## 2020-01-27 DIAGNOSIS — M5412 Radiculopathy, cervical region: Secondary | ICD-10-CM | POA: Diagnosis not present

## 2020-02-09 DIAGNOSIS — Z20822 Contact with and (suspected) exposure to covid-19: Secondary | ICD-10-CM | POA: Diagnosis not present

## 2020-02-09 DIAGNOSIS — J029 Acute pharyngitis, unspecified: Secondary | ICD-10-CM | POA: Diagnosis not present

## 2020-02-15 DIAGNOSIS — Z1152 Encounter for screening for COVID-19: Secondary | ICD-10-CM | POA: Diagnosis not present

## 2020-03-18 DIAGNOSIS — F331 Major depressive disorder, recurrent, moderate: Secondary | ICD-10-CM | POA: Diagnosis not present

## 2020-05-06 DIAGNOSIS — K644 Residual hemorrhoidal skin tags: Secondary | ICD-10-CM | POA: Diagnosis not present

## 2020-05-06 DIAGNOSIS — K625 Hemorrhage of anus and rectum: Secondary | ICD-10-CM | POA: Diagnosis not present

## 2020-05-06 DIAGNOSIS — L290 Pruritus ani: Secondary | ICD-10-CM | POA: Diagnosis not present

## 2020-05-26 DIAGNOSIS — J029 Acute pharyngitis, unspecified: Secondary | ICD-10-CM | POA: Diagnosis not present

## 2020-08-03 ENCOUNTER — Telehealth: Payer: Self-pay | Admitting: Family Medicine

## 2020-08-03 NOTE — Telephone Encounter (Signed)
Sure - ok to schedule in open slot.

## 2020-08-03 NOTE — Telephone Encounter (Signed)
Pt husband called in wanted to know if he can be a new patient under Dr. Reece Agar

## 2020-08-25 DIAGNOSIS — Z03818 Encounter for observation for suspected exposure to other biological agents ruled out: Secondary | ICD-10-CM | POA: Diagnosis not present

## 2020-08-25 DIAGNOSIS — Z20822 Contact with and (suspected) exposure to covid-19: Secondary | ICD-10-CM | POA: Diagnosis not present

## 2020-08-28 ENCOUNTER — Ambulatory Visit: Payer: Self-pay

## 2020-08-28 ENCOUNTER — Telehealth: Payer: Self-pay | Admitting: Nurse Practitioner

## 2020-08-28 ENCOUNTER — Other Ambulatory Visit: Payer: Self-pay

## 2020-08-28 DIAGNOSIS — J40 Bronchitis, not specified as acute or chronic: Secondary | ICD-10-CM

## 2020-08-28 DIAGNOSIS — Z20822 Contact with and (suspected) exposure to covid-19: Secondary | ICD-10-CM

## 2020-08-28 LAB — POC COVID19 BINAXNOW: SARS Coronavirus 2 Ag: NEGATIVE

## 2020-08-28 MED ORDER — AZITHROMYCIN 250 MG PO TABS: ORAL_TABLET | ORAL | 0 refills | Status: DC

## 2020-08-28 MED ORDER — ALBUTEROL SULFATE HFA 108 (90 BASE) MCG/ACT IN AERS: 2.0000 | INHALATION_SPRAY | Freq: Four times a day (QID) | RESPIRATORY_TRACT | 0 refills | Status: DC | PRN

## 2020-08-28 NOTE — Progress Notes (Signed)
   Subjective:    Patient ID: Laura Calderon, female    DOB: September 08, 1980, 40 y.o.   MRN: [id number]  HPI  40 year old female presenting to clinic with symptoms of sore throat cough sneezing chest pain wheezing and she has progressed to some chest discomfort from coughing.   Symptoms started 08/23/2020  She has been vaccinated and boosted for COVID-19  Denies any sick contacts  40year old with similar symptoms   She has had bronchitis in the past, has been using Mucinex for relief  SpO2 99%  [phone] Patient consents to telehealth appointment   Review of Systems  Constitutional: Positive for fatigue.  HENT: Positive for congestion, rhinorrhea and sore throat.   Respiratory: Positive for cough and wheezing.   Cardiovascular: Negative.   Gastrointestinal: Negative.   Genitourinary: Negative.   Musculoskeletal: Negative.   Neurological: Negative.    Past Medical History:  Diagnosis Date  . Abnormal Pap smear    colposcopy- normal  . Degenerative disc disease    L4 AND L5  . History of treatment for tuberculosis 1999   completed 6 mo therapy with medication and negative CXR  . Hx of colposcopy with cervical biopsy   . Hypoglycemia   . Migraine    hx of migraines, none recently  . Obesity   . Orthopnea    releated to vit d deficiency   . Urinary tract infection       Objective:   Physical Exam  This was a telehealth appointment after nurse visit for COVID-19 testing   Patient was in no acute distress during phone conversation with provider   Recent Results (from the past 2160 hour(s))  POC COVID-19     Status: Normal   Collection Time: 08/28/20 12:47 PM  Result Value Ref Range   SARS Coronavirus 2 Ag Negative Negative    Comment: Vaccinated, boosted, symptomatic patient. Aware of negative POC. Has phone appt with S.Ahkeem Goede FNP      Assessment & Plan:   Advised patient may continue mucinex, start inhaler and if no improvement after 24-48 hours or with  worsening symptoms start antibiotic.   Push fluids, rest.  With any acutely worsening symptoms return to clinic or seek medical attention as discussed.   Will send out PCR and follow up with results. She should be cleared to return to work Monday 08/31/2020 as symptoms have already been present for 5 days.   Meds ordered this encounter  Medications  . albuterol (VENTOLIN HFA) 108 (90 Base) MCG/ACT inhaler    Sig: Inhale 2 puffs into the lungs every 6 (six) hours as needed for wheezing or shortness of breath (rinse mouth after use).    Dispense:  8 g    Refill:  0  . azithromycin (ZITHROMAX) 250 MG tablet    Sig: Take 2 tablets on day one, then one tablet on days 2-5. Take with food    Dispense:  6 tablet    Refill:  0

## 2020-08-29 LAB — SARS-COV-2, NAA 2 DAY TAT

## 2020-08-29 LAB — NOVEL CORONAVIRUS, NAA: SARS-CoV-2, NAA: NOT DETECTED

## 2020-08-30 ENCOUNTER — Encounter: Payer: Self-pay | Admitting: Nurse Practitioner

## 2020-08-31 NOTE — Progress Notes (Signed)
Done

## 2020-09-14 ENCOUNTER — Ambulatory Visit: Payer: BC Managed Care – PPO | Admitting: Medical

## 2020-09-14 ENCOUNTER — Other Ambulatory Visit: Payer: Self-pay

## 2020-09-14 VITALS — BP 160/92 | HR 109 | Resp 16

## 2020-09-14 DIAGNOSIS — Z20822 Contact with and (suspected) exposure to covid-19: Secondary | ICD-10-CM

## 2020-09-14 DIAGNOSIS — J020 Streptococcal pharyngitis: Secondary | ICD-10-CM

## 2020-09-15 ENCOUNTER — Encounter: Payer: Self-pay | Admitting: Medical

## 2020-09-15 LAB — POCT RAPID STREP A (OFFICE): Rapid Strep A Screen: NEGATIVE

## 2020-09-15 LAB — POC COVID19 BINAXNOW: SARS Coronavirus 2 Ag: NEGATIVE

## 2020-09-21 ENCOUNTER — Encounter: Payer: Self-pay | Admitting: Medical

## 2020-09-21 DIAGNOSIS — F331 Major depressive disorder, recurrent, moderate: Secondary | ICD-10-CM | POA: Diagnosis not present

## 2020-09-21 NOTE — Progress Notes (Signed)
Subjective:    Patient ID: Laura Calderon, female    DOB: [birth year], 40 y.o.   MRN: [id number]  HPI 40 yo female in non acute distress consents to telemedicine appointment. Presents with complaints of worsening sore throat, fever 100.4 at home, chills, nausea ( upset stomach), headache ( slight). History of Strep Throat, last infection was November 2021. Also had vVral Bronchitis 2-3 weeks ago.  Put on a Z-Pak Temp 100.3 at home.  160/92 , patient states she forgot to take her blood prssure medication this morning. Otherwise all other vitals wnl.  Last menstrual period 09/03/2020, unknown if currently breastfeeding.  History of  Prediabetes recently loss 15 lbs on purpose. Using Octavia. Herniated disc C3-C4 with radiculopathy down right arme, sees [facility].  Blood pressure (!) 160/92, pulse (!) 109, resp. rate 16, last menstrual period 09/03/2020, SpO2 100 %, unknown if currently breastfeeding.   No Known Allergies   Past Medical History:  Diagnosis Date  . Abnormal Pap smear    colposcopy- normal  . Degenerative disc disease    L4 AND L5  . History of treatment for tuberculosis 1999   completed 6 mo therapy with medication and negative CXR  . Hx of colposcopy with cervical biopsy   . Hypoglycemia   . Migraine    hx of migraines, none recently  . Obesity   . Orthopnea    releated to vit d deficiency   . Urinary tract infection    Review of Systems  Constitutional: Positive for chills and fever.  HENT: Positive for sore throat.   Respiratory: Negative for cough, chest tightness, shortness of breath and wheezing.   Cardiovascular: Negative for chest pain.  Gastrointestinal: Positive for nausea. Negative for abdominal pain, constipation, diarrhea and vomiting.  Genitourinary: Negative.   Musculoskeletal: Negative for back pain.  Skin: Negative.   Allergic/Immunologic: Positive for environmental allergies.  Neurological: Positive for headaches.   Hematological: Positive for adenopathy.  Psychiatric/Behavioral: Negative.        Objective:   Physical Exam Vitals and nursing note reviewed.  Constitutional:      Appearance: Normal appearance. She is obese.  HENT:     Head: Normocephalic and atraumatic.     Jaw: There is normal jaw occlusion.     Right Ear: Ear canal and external ear normal. A middle ear effusion is present. Tympanic membrane is not erythematous, retracted or bulging.     Left Ear: Ear canal and external ear normal. A middle ear effusion is present. Tympanic membrane is not erythematous, retracted or bulging.     Nose: Congestion present.     Mouth/Throat:     Lips: Pink.     Mouth: Mucous membranes are moist.     Pharynx: Oropharynx is clear.     Tonsils: Tonsillar exudate present. No tonsillar abscesses. 1+ on the right. 3+ on the left.  Eyes:     Extraocular Movements: Extraocular movements intact.     Conjunctiva/sclera: Conjunctivae normal.     Pupils: Pupils are equal, round, and reactive to light.  Neck:     Trachea: Trachea normal.  Cardiovascular:     Rate and Rhythm: Normal rate and regular rhythm.  Pulmonary:     Effort: Pulmonary effort is normal.     Breath sounds: Normal breath sounds.  Musculoskeletal:        General: Normal range of motion.     Cervical back: Normal range of motion and neck supple.  Lymphadenopathy:  Cervical: Cervical adenopathy present.     Right cervical: Superficial cervical adenopathy present.     Left cervical: Superficial cervical adenopathy present.  Skin:    General: Skin is warm and dry.  Neurological:     General: No focal deficit present.     Mental Status: She is alert and oriented to person, place, and time. Mental status is at baseline.  Psychiatric:        Mood and Affect: Mood normal.        Behavior: Behavior normal.        Thought Content: Thought content normal.        Judgment: Judgment normal.   No change in voice Temp 100.4 in  clinic Left tonsil bigger> right tonsil Pregnancy test  negative   Strep test  negatiave Covid-19 test negative   Covid-19 test negative Strep test negative Assessment & Plan:  Tonsillitis  Soft foods, increase fluids. Augmenting as prescribed. Seasonal Allergies with ETD, Start taking Zyrtec or Claritin and OTC Flonase per package instructions. Clear liquid diet x 24 if tolerating advance to a bland diet. Then may resume regular diet if feeling better. Return to the clinic or seek medical care if not improving in 3-5 days. Patient verbalizes understanding and has no questions at discharge. Return to clinic in 3-5 days if not improving.

## 2020-09-21 NOTE — Patient Instructions (Signed)
Tonsillitis  Tonsillitis is an infection of the throat. This infection causes the tonsils to become red, tender, and swollen. Tonsils are tissues in the back of your throat. If bacteria caused your infection, antibiotic medicine will be given to you. Sometimes, symptoms of this infection can be treated with the use of medicines that lessen swelling (steroids). If your tonsillitis is very bad (severe) and happens often, you may need to get your tonsils removed (tonsillectomy). Follow these instructions at home: Medicines  Take over-the-counter and prescription medicines only as told by your doctor.  If you were prescribed an antibiotic, take it as told by your doctor. Do not stop taking the antibiotic even if you start to feel better. Eating and drinking  Drink enough fluid to keep your pee (urine) clear or pale yellow.  While your throat is sore, eat soft or liquid foods like: ? Soup. ? Sherbert. ? Instant breakfast drinks.  Drink warm fluids.  Eat frozen ice pops. General instructions  Rest as much as possible and get plenty of sleep.  Gargle with a salt-water mixture 3-4 times a day or as needed. To make a salt-water mixture, completely dissolve -1 tsp of salt in 1 cup of warm water.  Wash your hands often with soap and water. If there is no soap and water, use hand sanitizer.  Do not share cups, bottles, or other utensils until your symptoms are gone.  Do not smoke. If you need help quitting, ask your doctor.  Keep all follow-up visits as told by your doctor. This is important. Contact a doctor if:  You have large, tender lumps in your neck.  You have a fever that does not go away after 2-3 days.  You have a rash.  You cough up green, yellow-brown, or bloody fluid.  You cannot swallow liquids or food for 24 hours.  Only one of your tonsils is swollen. Get help right away if:  You have any new symptoms such as: ? Vomiting. ? Very bad headache. ? Stiff  neck. ? Chest pain. ? Trouble breathing or swallowing.  You have very bad throat pain and you also have drooling or voice changes.  You have very bad pain that is not helped by medicine.  You cannot fully open your mouth.  You have redness, swelling, or severe pain anywhere in your neck. Summary  Tonsillitis causes your tonsils to be red, tender, and swollen.  While your throat is sore, eat soft or liquid foods.  Gargle with a salt-water mixture 3-4 times a day or as needed.  Do not share cups, bottles, or other utensils until your symptoms are gone. This information is not intended to replace advice given to you by your health care provider. Make sure you discuss any questions you have with your health care provider. Document Revised: 06/02/2017 Document Reviewed: 07/26/2016 Elsevier Patient Education  2021 Elsevier Inc.  

## 2020-10-05 DIAGNOSIS — Z01419 Encounter for gynecological examination (general) (routine) without abnormal findings: Secondary | ICD-10-CM | POA: Diagnosis not present

## 2020-10-05 DIAGNOSIS — Z124 Encounter for screening for malignant neoplasm of cervix: Secondary | ICD-10-CM | POA: Diagnosis not present

## 2020-10-05 DIAGNOSIS — Z6836 Body mass index (BMI) 36.0-36.9, adult: Secondary | ICD-10-CM | POA: Diagnosis not present

## 2020-11-02 DIAGNOSIS — F331 Major depressive disorder, recurrent, moderate: Secondary | ICD-10-CM | POA: Diagnosis not present

## 2020-11-06 DIAGNOSIS — Z30430 Encounter for insertion of intrauterine contraceptive device: Secondary | ICD-10-CM | POA: Diagnosis not present

## 2020-11-06 DIAGNOSIS — Z3202 Encounter for pregnancy test, result negative: Secondary | ICD-10-CM | POA: Diagnosis not present

## 2020-11-08 DIAGNOSIS — Z20822 Contact with and (suspected) exposure to covid-19: Secondary | ICD-10-CM | POA: Diagnosis not present

## 2020-11-13 DIAGNOSIS — Z20822 Contact with and (suspected) exposure to covid-19: Secondary | ICD-10-CM | POA: Diagnosis not present

## 2020-12-04 DIAGNOSIS — Z30431 Encounter for routine checking of intrauterine contraceptive device: Secondary | ICD-10-CM | POA: Diagnosis not present

## 2020-12-04 DIAGNOSIS — N898 Other specified noninflammatory disorders of vagina: Secondary | ICD-10-CM | POA: Diagnosis not present

## 2020-12-26 ENCOUNTER — Other Ambulatory Visit: Payer: Self-pay | Admitting: Nurse Practitioner

## 2020-12-26 DIAGNOSIS — J40 Bronchitis, not specified as acute or chronic: Secondary | ICD-10-CM

## 2020-12-31 ENCOUNTER — Ambulatory Visit (INDEPENDENT_AMBULATORY_CARE_PROVIDER_SITE_OTHER): Payer: BC Managed Care – PPO | Admitting: Family Medicine

## 2020-12-31 ENCOUNTER — Other Ambulatory Visit: Payer: Self-pay

## 2020-12-31 ENCOUNTER — Ambulatory Visit: Payer: BC Managed Care – PPO | Admitting: Family Medicine

## 2020-12-31 VITALS — BP 130/76 | HR 85 | Temp 98.0°F | Resp 16 | Wt 264.0 lb

## 2020-12-31 DIAGNOSIS — M545 Low back pain, unspecified: Secondary | ICD-10-CM | POA: Diagnosis not present

## 2020-12-31 DIAGNOSIS — Z03818 Encounter for observation for suspected exposure to other biological agents ruled out: Secondary | ICD-10-CM | POA: Diagnosis not present

## 2020-12-31 DIAGNOSIS — Z20822 Contact with and (suspected) exposure to covid-19: Secondary | ICD-10-CM | POA: Diagnosis not present

## 2020-12-31 MED ORDER — DICLOFENAC SODIUM 75 MG PO TBEC: 75.0000 mg | DELAYED_RELEASE_TABLET | Freq: Two times a day (BID) | ORAL | 0 refills | Status: DC

## 2020-12-31 NOTE — Progress Notes (Signed)
Patient ID: ELLOWYN RIEVES, female    DOB: [birth year], 40 y.o.   MRN: [id number]  This visit was conducted in person.  BP 130/76   Pulse 85   Temp 98 F (36.7 C)   Resp 16   Wt 264 lb (119.7 kg)   SpO2 99%   BMI 37.88 kg/m    CC: Chief Complaint  Patient presents with   Back Pain    Subjective:   HPI: Laura Calderon is a 40 y.o. female presenting on 12/31/2020 for Back Pain  New onset pain after travelling.. right low back  pain.  No falls, no MVA.. did a lot of dancing. Deep breaths hurting.  No radiation of pain.  Pain with BM.  Pain keeping her up at night. 4-5/10 at night 6-7/10   HX of cervical herniated disc,  DDD in lumbar spine.   Flexeril did not help except with sleeping.  Ibuprofen 600 mg  once daily.. helps some temporarily.  Hx of  remote ESI, no surgeries.     Relevant past medical, surgical, family and social history reviewed and updated as indicated. Interim medical history since our last visit reviewed. Allergies and medications reviewed and updated. Outpatient Medications Prior to Visit  Medication Sig Dispense Refill   amLODipine (NORVASC) 5 MG tablet 1 tablet     Cholecalciferol (VITAMIN D3) 250 MCG (10000 UT) TABS See admin instructions.     Melatonin Gummies 2.5 MG CHEW See admin instructions.     albuterol (VENTOLIN HFA) 108 (90 Base) MCG/ACT inhaler INHALE 2 PUFFS INTO LUNGS EVERY 6 HOURS AS NEEDED FOR WHEEZE/SHORT OF BREATH (RINSE MOUTH AFTER USE) (Patient not taking: Reported on 12/31/2020) 8.5 each 1   azithromycin (ZITHROMAX) 250 MG tablet Take 2 tablets on day one, then one tablet on days 2-5. Take with food (Patient not taking: Reported on 12/31/2020) 6 tablet 0   norgestimate-ethinyl estradiol (ORTHO-CYCLEN) 0.25-35 MG-MCG tablet Take 1 tablet by mouth daily. (Patient not taking: Reported on 12/31/2020)     No facility-administered medications prior to visit.     Per HPI unless specifically indicated in ROS section below Review  of Systems  Constitutional:  Negative for fatigue and fever.  HENT:  Negative for ear pain.   Eyes:  Negative for pain.  Respiratory:  Negative for chest tightness and shortness of breath.   Cardiovascular:  Negative for chest pain, palpitations and leg swelling.  Gastrointestinal:  Negative for abdominal pain.  Genitourinary:  Negative for dysuria.  Objective:  BP 130/76   Pulse 85   Temp 98 F (36.7 C)   Resp 16   Wt 264 lb (119.7 kg)   SpO2 99%   BMI 37.88 kg/m   Wt Readings from Last 3 Encounters:  12/31/20 264 lb (119.7 kg)  11/07/19 255 lb 5 oz (115.8 kg)  11/01/19 255 lb 2 oz (115.7 kg)      Physical Exam Constitutional:      General: She is not in acute distress.    Appearance: Normal appearance. She is well-developed. She is not ill-appearing or toxic-appearing.  HENT:     Head: Normocephalic.     Right Ear: Hearing, tympanic membrane, ear canal and external ear normal. Tympanic membrane is not erythematous, retracted or bulging.     Left Ear: Hearing, tympanic membrane, ear canal and external ear normal. Tympanic membrane is not erythematous, retracted or bulging.     Nose: No mucosal edema or rhinorrhea.  Right Sinus: No maxillary sinus tenderness or frontal sinus tenderness.     Left Sinus: No maxillary sinus tenderness or frontal sinus tenderness.     Mouth/Throat:     Pharynx: Uvula midline.  Eyes:     General: Lids are normal. Lids are everted, no foreign bodies appreciated.     Conjunctiva/sclera: Conjunctivae normal.     Pupils: Pupils are equal, round, and reactive to light.  Neck:     Thyroid: No thyroid mass or thyromegaly.     Vascular: No carotid bruit.     Trachea: Trachea normal.  Cardiovascular:     Rate and Rhythm: Normal rate and regular rhythm.     Pulses: Normal pulses.     Heart sounds: Normal heart sounds, S1 normal and S2 normal. No murmur heard.   No friction rub. No gallop.  Pulmonary:     Effort: Pulmonary effort is normal. No  tachypnea or respiratory distress.     Breath sounds: Normal breath sounds. No decreased breath sounds, wheezing, rhonchi or rales.  Abdominal:     General: Bowel sounds are normal.     Palpations: Abdomen is soft.     Tenderness: There is no abdominal tenderness.  Musculoskeletal:     Cervical back: Normal, normal range of motion and neck supple.     Thoracic back: Normal.     Lumbar back: Tenderness present. No bony tenderness. Decreased range of motion. Negative right straight leg raise test and negative left straight leg raise test.  Skin:    General: Skin is warm and dry.     Findings: No rash.  Neurological:     Mental Status: She is alert.  Psychiatric:        Mood and Affect: Mood is not anxious or depressed.        Speech: Speech normal.        Behavior: Behavior normal. Behavior is cooperative.        Thought Content: Thought content normal.        Judgment: Judgment normal.      Results for orders placed or performed in visit on 09/14/20  POCT rapid strep A  Result Value Ref Range   Rapid Strep A Screen Negative Negative    This visit occurred during the SARS-CoV-2 public health emergency.  Safety protocols were in place, including screening questions prior to the visit, additional usage of staff PPE, and extensive cleaning of exam room while observing appropriate contact time as indicated for disinfecting solutions.   COVID 19 screen:  No recent travel or known exposure to COVID19 The patient denies respiratory symptoms of COVID 19 at this time. The importance of social distancing was discussed today.   Assessment and Plan Problem List Items Addressed This Visit     Right lumbar pain - Primary    Acute Start diclofenac 75 mg  twice daily for pain and inflammation.  Hold ibuprofen.  Continue flexeril at bedtime as needed prn  Start heat and gentle stretching.  Call for follow up if not improving 2 weeks.      Relevant Medications   diclofenac (VOLTAREN) 75  MG EC tablet       Kerby Nora, MD

## 2020-12-31 NOTE — Patient Instructions (Addendum)
Start diclofenac 75 mg  twice daily for pain and inflammation.  Hold ibuprofen.  Continue flexeril at bedtime as needed prn  Start heat and gentle stretching.  Call for follow up if not improving 2 weeks.

## 2021-01-08 ENCOUNTER — Other Ambulatory Visit: Payer: Self-pay | Admitting: Family Medicine

## 2021-01-08 MED ORDER — PREDNISONE 20 MG PO TABS: ORAL_TABLET | ORAL | 0 refills | Status: DC

## 2021-01-28 DIAGNOSIS — Z03818 Encounter for observation for suspected exposure to other biological agents ruled out: Secondary | ICD-10-CM | POA: Diagnosis not present

## 2021-01-28 DIAGNOSIS — Z20822 Contact with and (suspected) exposure to covid-19: Secondary | ICD-10-CM | POA: Diagnosis not present

## 2021-02-01 ENCOUNTER — Encounter: Payer: Self-pay | Admitting: Family Medicine

## 2021-02-01 DIAGNOSIS — R52 Pain, unspecified: Secondary | ICD-10-CM | POA: Diagnosis not present

## 2021-02-01 DIAGNOSIS — R509 Fever, unspecified: Secondary | ICD-10-CM | POA: Diagnosis not present

## 2021-02-01 DIAGNOSIS — U071 COVID-19: Secondary | ICD-10-CM | POA: Diagnosis not present

## 2021-02-01 DIAGNOSIS — R051 Acute cough: Secondary | ICD-10-CM | POA: Diagnosis not present

## 2021-02-02 NOTE — Telephone Encounter (Signed)
I spoke with pt; pt tested + covid for home test on 02/01/21; symptoms started on 02/01/21. pt said fever went to 104; pt went to [facility] with virtual visit. Pt said she was started on Paxlovid; pt has not picked up yet but will start today. Temp this morning now is 99.2 after taking ibuprofen 600 mg. Pt having chills, pt having prod cough with clear phlegm; no SOB but has chest hurting in center of chest and pt thinks due to coughing. Pt said has chest pain only when coughs. Pt said has lost sense of taste; S/t is scratchy at this time, runny nose and head congestion. Pt said she is in no distress at this time. Pt wants to know if should use the albuterol inhaler for the congestion and cough. Pt wants to know if OK to take ibuprofen or should pt be taking tylenol. Pt wants to verify ok to take paxlovid with amlodipine; pt will self quarantine, drink plenty of fluids, rest and UC & ED precautions given and pt voiced understanding.  Sending note to DR Jannet Mantis CMA. Will send also by teams.

## 2021-02-02 NOTE — Telephone Encounter (Addendum)
Ok to use albuterol inhaler. Recommend start with tylenol and if fever uncontrolled then may take ibuprofen.  Glad she got paxlovid - it is a good drug but has lots of drug interactions:  Amlodipine - may increase concentration so monitor blood pressures and if too low then hold med.  Birth control - paxlovid will increase levels of progesterone and decrease levels of estrogen in birth control - use 2nd form of birth control while on it.  Otherwise should do ok. Keep [place] updated with symptoms. Recommend self isolation for 5 days starting from day after first day of symptoms, then if feeling better may stop isolation  but recommend wearing mask for total 10 days when around others.

## 2021-02-02 NOTE — Addendum Note (Signed)
Addended by: Eustaquio Boyden on: 02/02/2021 10:33 AM   Modules accepted: Orders

## 2021-02-02 NOTE — Telephone Encounter (Signed)
Plz triage pt.  °

## 2021-02-03 MED ORDER — CHERATUSSIN AC 100-10 MG/5ML PO SOLN: 5.0000 mL | Freq: Three times a day (TID) | ORAL | 0 refills | Status: DC | PRN

## 2021-02-03 NOTE — Addendum Note (Signed)
Addended by: Eustaquio Boyden on: 02/03/2021 05:56 PM   Modules accepted: Orders

## 2021-02-24 DIAGNOSIS — M545 Low back pain, unspecified: Secondary | ICD-10-CM | POA: Insufficient documentation

## 2021-02-24 NOTE — Assessment & Plan Note (Signed)
Acute Start diclofenac 75 mg  twice daily for pain and inflammation.  Hold ibuprofen.  Continue flexeril at bedtime as needed prn  Start heat and gentle stretching.  Call for follow up if not improving 2 weeks.

## 2021-04-13 DIAGNOSIS — Z23 Encounter for immunization: Secondary | ICD-10-CM | POA: Diagnosis not present

## 2021-05-03 ENCOUNTER — Ambulatory Visit (INDEPENDENT_AMBULATORY_CARE_PROVIDER_SITE_OTHER)
Admission: RE | Admit: 2021-05-03 | Discharge: 2021-05-03 | Disposition: A | Discharge status: Home / Self Care | Payer: BC Managed Care – PPO | Source: Ambulatory Visit | Attending: Family Medicine | Admitting: Family Medicine

## 2021-05-03 ENCOUNTER — Encounter: Payer: Self-pay | Admitting: Family Medicine

## 2021-05-03 ENCOUNTER — Other Ambulatory Visit: Payer: Self-pay

## 2021-05-03 ENCOUNTER — Ambulatory Visit: Payer: BC Managed Care – PPO | Admitting: Family Medicine

## 2021-05-03 VITALS — BP 154/100 | HR 85 | Temp 97.6°F | Ht 70.0 in | Wt 271.2 lb

## 2021-05-03 DIAGNOSIS — R0600 Dyspnea, unspecified: Secondary | ICD-10-CM | POA: Diagnosis not present

## 2021-05-03 DIAGNOSIS — I10 Essential (primary) hypertension: Secondary | ICD-10-CM

## 2021-05-03 DIAGNOSIS — R079 Chest pain, unspecified: Secondary | ICD-10-CM | POA: Diagnosis not present

## 2021-05-03 DIAGNOSIS — R0609 Other forms of dyspnea: Secondary | ICD-10-CM | POA: Diagnosis not present

## 2021-05-03 DIAGNOSIS — M25471 Effusion, right ankle: Secondary | ICD-10-CM

## 2021-05-03 LAB — CBC WITH DIFFERENTIAL/PLATELET
Basophils Absolute: 0.1 10*3/uL (ref 0.0–0.1)
Basophils Relative: 1 % (ref 0.0–3.0)
Eosinophils Absolute: 0.2 10*3/uL (ref 0.0–0.7)
Eosinophils Relative: 2.2 % (ref 0.0–5.0)
HCT: 41.5 % (ref 36.0–46.0)
Hemoglobin: 13.4 g/dL (ref 12.0–15.0)
Lymphocytes Relative: 25.9 % (ref 12.0–46.0)
Lymphs Abs: 2.2 10*3/uL (ref 0.7–4.0)
MCHC: 32.2 g/dL (ref 30.0–36.0)
MCV: 79.3 fl (ref 78.0–100.0)
Monocytes Absolute: 0.6 10*3/uL (ref 0.1–1.0)
Monocytes Relative: 7.4 % (ref 3.0–12.0)
Neutro Abs: 5.4 10*3/uL (ref 1.4–7.7)
Neutrophils Relative %: 63.5 % (ref 43.0–77.0)
Platelets: 297 10*3/uL (ref 150.0–400.0)
RBC: 5.23 Mil/uL — ABNORMAL HIGH (ref 3.87–5.11)
RDW: 13.8 % (ref 11.5–15.5)
WBC: 8.6 10*3/uL (ref 4.0–10.5)

## 2021-05-03 LAB — BASIC METABOLIC PANEL
BUN: 16 mg/dL (ref 6–23)
CO2: 27 mEq/L (ref 19–32)
Calcium: 9.3 mg/dL (ref 8.4–10.5)
Chloride: 106 mEq/L (ref 96–112)
Creatinine, Ser: 1.03 mg/dL (ref 0.40–1.20)
GFR: 67.91 mL/min (ref >60.00)
Glucose, Bld: 105 mg/dL — ABNORMAL HIGH (ref 70–99)
Potassium: 4.5 mEq/L (ref 3.5–5.1)
Sodium: 139 mEq/L (ref 135–145)

## 2021-05-03 LAB — D-DIMER, QUANTITATIVE: D-Dimer, Quant: 0.39 mcg/mL FEU (ref <0.50)

## 2021-05-03 LAB — TSH: TSH: 2 u[IU]/mL (ref 0.35–5.50)

## 2021-05-03 MED ORDER — IOHEXOL 350 MG/ML SOLN
80.0000 mL | Freq: Once | INTRAVENOUS | Status: AC | PRN
  Administered 2021-05-03: 80 mL via INTRAVENOUS

## 2021-05-03 NOTE — Patient Instructions (Addendum)
We will refer you to foot doctor for evaluation of lateral right ankle swelling and lump. Labs today I'd like you to get a contrasted CT of the lungs today.  Our referral coordinator will discuss details of CT scan.

## 2021-05-03 NOTE — Progress Notes (Signed)
Patient ID: Laura Calderon, female    DOB: 1980/08/23, 40 y.o.   MRN: [id number]  This visit was conducted in person.  BP (!) 154/100   Pulse 85   Temp 97.6 F (36.4 C) (Temporal)   Ht 5\' 10"  (1.778 m)   Wt 271 lb 3 oz (123 kg)   LMP 03/07/2021 Comment: Irregular since IUD  SpO2 97%   BMI 38.91 kg/m    CC: chest pain, ankle pain Subjective:   HPI: Laura Calderon is a 40 y.o. female presenting on 05/03/2021 for Chest Pain (C/o chest pain. Started about 2 wks ago after some traveling.  Also, c/o SOB, especially since gaining wt after having IUD placed. ) and Joint Swelling (C/o right ankle swelling.  Started about 6 mos ago.  Also, c/o bump in the area, increasing in size. 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 Notes increasing shortness of breath with exertion even changing the sheets or getting dressed. Most noticeable over the past 2 wks.  IUD placed 12/2020 - has since gained 15 lbs. Thinks she has Mirena IUD in place.  Leaving tomorrow for another trip.  She had COVID infection in July - didn't have dyspnea as a symptom. Dyspnea not noted until 03/31/2021.   Notes some lateral R ankle swelling over the past 6 months as well as lump to lateral R ankle. Localized swelling worse with prolonged walking or standing.  No HA, vision changes.   Regular with amlodipine - has not been checking BP at home.  No personal or family history of blood clots.      Relevant past medical, surgical, family and social history reviewed and updated as indicated. Interim medical history since our last visit reviewed. Allergies and medications reviewed and updated. Outpatient Medications Prior to Visit  Medication Sig Dispense Refill   amLODipine (NORVASC) 5 MG tablet 1 tablet     Cholecalciferol (VITAMIN D3) 250 MCG (10000 UT) TABS See admin instructions.     diclofenac (VOLTAREN) 75 MG EC tablet Take 1 tablet (75 mg total) by mouth 2 (two) times daily. 30 tablet 0    Melatonin Gummies 2.5 MG CHEW See admin instructions.     albuterol (VENTOLIN HFA) 108 (90 Base) MCG/ACT inhaler INHALE 2 PUFFS INTO LUNGS EVERY 6 HOURS AS NEEDED FOR WHEEZE/SHORT OF BREATH (RINSE MOUTH AFTER USE) (Patient not taking: Reported on 12/31/2020) 8.5 each 1   guaiFENesin-codeine (CHERATUSSIN AC) 100-10 MG/5ML syrup Take 5 mLs by mouth 3 (three) times daily as needed for cough (sedation precautions). 120 mL 0   norgestimate-ethinyl estradiol (ORTHO-CYCLEN) 0.25-35 MG-MCG tablet Take 1 tablet by mouth daily. (Patient not taking: Reported on 12/31/2020)     No facility-administered medications prior to visit.     Per HPI unless specifically indicated in ROS section below Review of Systems  Objective:  BP (!) 154/100   Pulse 85   Temp 97.6 F (36.4 C) (Temporal)   Ht 5\' 10"  (1.778 m)   Wt 271 lb 3 oz (123 kg)   LMP 03/07/2021 Comment: Irregular since IUD  SpO2 97%   BMI 38.91 kg/m   Wt Readings from Last 3 Encounters:  05/03/21 271 lb 3 oz (123 kg)  12/31/20 264 lb (119.7 kg)  11/07/19 255 lb 5 oz (115.8 kg)      Physical Exam Vitals and nursing note reviewed.  Constitutional:      Appearance: Normal appearance. She is not ill-appearing.  Eyes:     Extraocular Movements: Extraocular movements intact.     Pupils: Pupils are equal, round, and reactive to light.  Neck:     Thyroid: No thyroid mass or thyromegaly.  Cardiovascular:     Rate and Rhythm: Normal rate and regular rhythm.     Pulses: Normal pulses.     Heart sounds: Normal heart sounds. No murmur heard. Pulmonary:     Effort: Pulmonary effort is normal. No respiratory distress.     Breath sounds: Normal breath sounds. No wheezing, rhonchi or rales.  Musculoskeletal:        General: Swelling (R lateral ankle with firm nodule anteriorly) present.     Right lower leg: No edema.     Left lower leg: No edema.  Skin:    General: Skin is warm and dry.     Findings: No erythema or rash.  Neurological:      Mental Status: She is alert.  Psychiatric:        Mood and Affect: Mood normal.        Behavior: Behavior normal.      Results for orders placed or performed in visit on 09/14/20  POCT rapid strep A  Result Value Ref Range   Rapid Strep A Screen Negative Negative   EKG - NSR rate 75, normal axis, intervals, no hypertrophy or acute ST/T changes  Assessment & Plan:  This visit occurred during the SARS-CoV-2 public health emergency.  Safety protocols were in place, including screening questions prior to the visit, additional usage of staff PPE, and extensive cleaning of exam room while observing appropriate contact time as indicated for disinfecting solutions.   Problem List Items Addressed This Visit     Hypertension    BP elevated despite amlodipine 5mg  daily.  rec start checking BP more regularly and if persistently >140/90, further titrate meds.       Severe obesity (BMI 35.0-39.9) with comorbidity (HCC)   Right ankle swelling    Localized lateral ankle swelling now with firm nodule anteriorly.  Unclear cause, not consistent with tendonitis, lipoma. ?bursitis - will refer to podiatry.       Relevant Orders   Ambulatory referral to Podiatry   Exertional dyspnea - Primary    Dyspnea with minimal exertion in setting of Mirena IUD and recent prolonged air travel.  Will need to r/o PE - will order CTA as well as D dimer, TSH, CBC, BMP.  Pt agrees with plan.  This is not consistent with post-COVID dyspnea.       Relevant Orders   D-dimer, quantitative   CBC with Differential/Platelet   Basic metabolic panel   TSH   Other Visit Diagnoses     Chest pain, unspecified type       Relevant Orders   EKG 12-Lead (Completed)   CT Angio Chest W/Cm &/Or Wo Cm        No orders of the defined types were placed in this encounter.  Orders Placed This Encounter  Procedures   CT Angio Chest W/Cm &/Or Wo Cm    [place]/LY- BC/BS HTN. No DM. Getting STAT labs    Standing Status:    Future    Standing Expiration Date:   05/03/2022    Order Specific Question:   If indicated for the ordered procedure, I authorize the administration of contrast media per Radiology protocol    Answer:   Yes    Order Specific Question:   Is patient pregnant?  Answer:   No    Order Specific Question:   Preferred imaging location?    Answer:   [facility] - [address]   D-dimer, quantitative   CBC with Differential/Platelet   Basic metabolic panel   TSH   Ambulatory referral to Podiatry    Referral Priority:   Routine    Referral Type:   Consultation    Referral Reason:   Specialty Services Required    Requested Specialty:   Podiatry    Number of Visits Requested:   1   EKG 12-Lead    Patient Instructions  We will refer you to foot doctor for evaluation of lateral right ankle swelling and lump. Labs today I'd like you to get a contrasted CT of the lungs today.  Our referral coordinator will discuss details of CT scan.   Follow up plan: No follow-ups on file.  Eustaquio Boyden, MD

## 2021-05-03 NOTE — Assessment & Plan Note (Signed)
BP elevated despite amlodipine 5mg  daily.  rec start checking BP more regularly and if persistently >140/90, further titrate meds.

## 2021-05-03 NOTE — Assessment & Plan Note (Addendum)
Localized lateral ankle swelling now with firm nodule anteriorly.  Unclear cause, not consistent with tendonitis, lipoma. ?bursitis - will refer to podiatry.

## 2021-05-03 NOTE — Assessment & Plan Note (Signed)
Dyspnea with minimal exertion in setting of Mirena IUD and recent prolonged air travel.  Will need to r/o PE - will order CTA as well as D dimer, TSH, CBC, BMP.  Pt agrees with plan.  This is not consistent with post-COVID dyspnea.

## 2021-05-04 ENCOUNTER — Encounter: Payer: Self-pay | Admitting: Family Medicine

## 2021-05-04 DIAGNOSIS — R7303 Prediabetes: Secondary | ICD-10-CM

## 2021-05-04 DIAGNOSIS — R0609 Other forms of dyspnea: Secondary | ICD-10-CM

## 2021-05-11 MED ORDER — METFORMIN HCL 500 MG PO TABS
500.0000 mg | ORAL_TABLET | Freq: Every day | ORAL | 1 refills | Status: DC
Start: 2021-05-11 — End: 2021-09-15

## 2021-05-11 NOTE — Addendum Note (Signed)
Addended by: Eustaquio Boyden on: 05/11/2021 09:05 AM   Modules accepted: Orders

## 2021-05-24 DIAGNOSIS — N9489 Other specified conditions associated with female genital organs and menstrual cycle: Secondary | ICD-10-CM | POA: Diagnosis not present

## 2021-05-24 DIAGNOSIS — N898 Other specified noninflammatory disorders of vagina: Secondary | ICD-10-CM | POA: Diagnosis not present

## 2021-05-24 DIAGNOSIS — Z30432 Encounter for removal of intrauterine contraceptive device: Secondary | ICD-10-CM | POA: Diagnosis not present

## 2021-06-04 ENCOUNTER — Other Ambulatory Visit: Payer: Self-pay | Admitting: Family Medicine

## 2021-06-04 MED ORDER — AMLODIPINE BESYLATE 5 MG PO TABS
5.0000 mg | ORAL_TABLET | Freq: Every day | ORAL | 0 refills | Status: DC
Start: 2021-06-04 — End: 2021-09-13

## 2021-06-04 NOTE — Telephone Encounter (Signed)
E-scribed refill.  Plz schedule lab and cpe visits.  

## 2021-06-07 NOTE — Telephone Encounter (Signed)
Pt scheduled a lab/cpe in march 2023 °

## 2021-06-15 ENCOUNTER — Encounter: Payer: Self-pay | Admitting: Family Medicine

## 2021-06-16 ENCOUNTER — Other Ambulatory Visit: Payer: Self-pay

## 2021-06-16 ENCOUNTER — Encounter: Payer: Self-pay | Admitting: Family Medicine

## 2021-06-16 ENCOUNTER — Telehealth (INDEPENDENT_AMBULATORY_CARE_PROVIDER_SITE_OTHER): Payer: BC Managed Care – PPO | Admitting: Family Medicine

## 2021-06-16 VITALS — BP 138/101 | HR 86 | Temp 98.7°F | Ht 70.0 in | Wt 270.0 lb

## 2021-06-16 DIAGNOSIS — U071 COVID-19: Secondary | ICD-10-CM

## 2021-06-16 DIAGNOSIS — I10 Essential (primary) hypertension: Secondary | ICD-10-CM | POA: Diagnosis not present

## 2021-06-16 HISTORY — DX: COVID-19: U07.1

## 2021-06-16 MED ORDER — HYDROCOD POLST-CPM POLST ER 10-8 MG/5ML PO SUER: 5.0000 mL | Freq: Two times a day (BID) | ORAL | 0 refills | Status: DC | PRN

## 2021-06-16 MED ORDER — PREDNISONE 20 MG PO TABS: ORAL_TABLET | ORAL | 0 refills | Status: DC

## 2021-06-16 NOTE — Progress Notes (Signed)
Patient ID: Laura Calderon, female    DOB: [birth year], 40 y.o.   MRN: [id number]  Virtual visit completed through MyChart, a video enabled telemedicine application. Due to national recommendations of social distancing due to COVID-19, a virtual visit is felt to be most appropriate for this patient at this time. Reviewed limitations, risks, security and privacy concerns of performing a virtual visit and the availability of in person appointments. I also reviewed that there may be a patient responsible charge related to this service. The patient agreed to proceed.   Patient location: home Provider location: [place] at [facility], office Persons participating in this virtual visit: patient, provider   If any vitals were documented, they were collected by patient at home unless specified below.    BP (!) 138/101    Pulse 86    Temp 98.7 F (37.1 C)    Ht 5\' 10"  (1.778 m)    Wt 270 lb (122.5 kg)    LMP 05/27/2021    BMI 38.74 kg/m    CC: COVID Subjective:   HPI: Laura Calderon is a 40 y.o. female presenting on 06/16/2021 for Covid Positive (C/o dry cough, fever- max 103, chills, body aches and loss of taste/smell.  Sxs started 06/12/21.  Pos home COVID test, 06/14/21.  Latest COVID booster- 06/2020, flu- 04/13/21. )   First day of symptoms: 06/09/2021 Tested COVID positive: 06/14/2021  Current symptoms: fever Tmax 103 (06/13/2021) last fever was yesterday at 101, sneezing, loss of taste/smell, dry persistent cough progressively worsening, head > chest congestion. Dyspnea associated with coughing fits, occasional wheezing.  No h/o asthma.  Treatments to date: cheratussin codeine - no benefit. Mucinex and ibuprofen as well. She does have albuterol at home she can use.  Risk factors include: BMI >35, HTN  Rest of family feeling well.   COVID vaccination status: vaccinated x3, latest 06/2020   Last COVID infection 02/2021 - treated with Paxlovid at that time.   BP elevated today -  attributed to acute illness. Other recent BP readings have been normal.      Relevant past medical, surgical, family and social history reviewed and updated as indicated. Interim medical history since our last visit reviewed. Allergies and medications reviewed and updated. Outpatient Medications Prior to Visit  Medication Sig Dispense Refill   amLODipine (NORVASC) 5 MG tablet Take 1 tablet (5 mg total) by mouth daily. 90 tablet 0   Melatonin Gummies 2.5 MG CHEW See admin instructions. As needed     metFORMIN (GLUCOPHAGE) 500 MG tablet Take 1 tablet (500 mg total) by mouth daily with breakfast. 90 tablet 1   Cholecalciferol (VITAMIN D3) 250 MCG (10000 UT) TABS See admin instructions. (Patient not taking: Reported on 06/16/2021)     diclofenac (VOLTAREN) 75 MG EC tablet Take 1 tablet (75 mg total) by mouth 2 (two) times daily. 30 tablet 0   No facility-administered medications prior to visit.     Per HPI unless specifically indicated in ROS section below Review of Systems Objective:  BP (!) 138/101    Pulse 86    Temp 98.7 F (37.1 C)    Ht 5\' 10"  (1.778 m)    Wt 270 lb (122.5 kg)    LMP 05/27/2021    BMI 38.74 kg/m   Wt Readings from Last 3 Encounters:  06/16/21 270 lb (122.5 kg)  05/03/21 271 lb 3 oz (123 kg)  12/31/20 264 lb (119.7 kg)       Physical exam:  Gen: alert, NAD, not ill appearing Pulm: speaks in complete sentences without increased work of breathing Psych: normal mood, normal thought content      Assessment & Plan:   Problem List Items Addressed This Visit     Hypertension    BP elevation today attributed to acute illness.  She states home readings and other office readings have been in well controlled range.       Severe obesity (BMI 35.0-39.9) with comorbidity (HCC)   COVID-19 virus infection - Primary    Predominant concern is ongoing cough, not responsive to cheratussin. Will Rx tusssionex pennkinetic, prednisone taper (has previously tolerated well), and  she will start PRN albuterol inhaler she has at home.  Reviewed currently approved EUA treatments. Specifically discussed paxlovid option - given duration of illness, she is likely outside of 5d window for optimal antiviral administration, and she prefers to defer treatment at this time.  Reviewed expected course of illness, anticipated course of recovery, as well as red flags to suggest COVID pneumonia and/or to seek urgent in-person care. Reviewed latest CDC isolation/quarantine guidelines.  Encouraged fluids and rest. Reviewed further supportive care measures at home including vit C 500mg  bid, vit D 2000 IU daily, zinc 100mg  daily, tylenol PRN, pepcid 20mg  BID PRN.          Meds ordered this encounter  Medications   chlorpheniramine-HYDROcodone (TUSSIONEX PENNKINETIC ER) 10-8 MG/5ML SUER    Sig: Take 5 mLs by mouth every 12 (twelve) hours as needed for cough.    Dispense:  115 mL    Refill:  0   predniSONE (DELTASONE) 20 MG tablet    Sig: Take two tablets daily for 3 days followed by one tablet daily for 4 days    Dispense:  10 tablet    Refill:  0   No orders of the defined types were placed in this encounter.   I discussed the assessment and treatment plan with the patient. The patient was provided an opportunity to ask questions and all were answered. The patient agreed with the plan and demonstrated an understanding of the instructions. The patient was advised to call back or seek an in-person evaluation if the symptoms worsen or if the condition fails to improve as anticipated.  Follow up plan: No follow-ups on file.  , MD

## 2021-06-16 NOTE — Assessment & Plan Note (Addendum)
Predominant concern is ongoing cough, not responsive to cheratussin. Will Rx tusssionex pennkinetic, prednisone taper (has previously tolerated well), and she will start PRN albuterol inhaler she has at home.  Reviewed currently approved EUA treatments. Specifically discussed paxlovid option - given duration of illness, she is likely outside of 5d window for optimal antiviral administration, and she prefers to defer treatment at this time.  Reviewed expected course of illness, anticipated course of recovery, as well as red flags to suggest COVID pneumonia and/or to seek urgent in-person care. Reviewed latest CDC isolation/quarantine guidelines.  Encouraged fluids and rest. Reviewed further supportive care measures at home including vit C 500mg  bid, vit D 2000 IU daily, zinc 100mg  daily, tylenol PRN, pepcid 20mg  BID PRN.

## 2021-06-16 NOTE — Telephone Encounter (Signed)
Pt has been added to schedule ?

## 2021-06-16 NOTE — Assessment & Plan Note (Signed)
BP elevation today attributed to acute illness.  She states home readings and other office readings have been in well controlled range.

## 2021-08-02 DIAGNOSIS — F331 Major depressive disorder, recurrent, moderate: Secondary | ICD-10-CM | POA: Diagnosis not present

## 2021-08-10 DIAGNOSIS — F331 Major depressive disorder, recurrent, moderate: Secondary | ICD-10-CM | POA: Diagnosis not present

## 2021-08-24 DIAGNOSIS — F331 Major depressive disorder, recurrent, moderate: Secondary | ICD-10-CM | POA: Diagnosis not present

## 2021-08-30 DIAGNOSIS — F410 Panic disorder [episodic paroxysmal anxiety] without agoraphobia: Secondary | ICD-10-CM | POA: Diagnosis not present

## 2021-08-30 DIAGNOSIS — F331 Major depressive disorder, recurrent, moderate: Secondary | ICD-10-CM | POA: Diagnosis not present

## 2021-09-06 ENCOUNTER — Other Ambulatory Visit: Payer: Self-pay | Admitting: Family Medicine

## 2021-09-06 DIAGNOSIS — I10 Essential (primary) hypertension: Secondary | ICD-10-CM

## 2021-09-06 DIAGNOSIS — D649 Anemia, unspecified: Secondary | ICD-10-CM

## 2021-09-06 DIAGNOSIS — Z1159 Encounter for screening for other viral diseases: Secondary | ICD-10-CM

## 2021-09-06 DIAGNOSIS — F331 Major depressive disorder, recurrent, moderate: Secondary | ICD-10-CM | POA: Diagnosis not present

## 2021-09-06 DIAGNOSIS — E559 Vitamin D deficiency, unspecified: Secondary | ICD-10-CM

## 2021-09-06 DIAGNOSIS — F410 Panic disorder [episodic paroxysmal anxiety] without agoraphobia: Secondary | ICD-10-CM | POA: Diagnosis not present

## 2021-09-06 DIAGNOSIS — R7303 Prediabetes: Secondary | ICD-10-CM

## 2021-09-08 ENCOUNTER — Other Ambulatory Visit (INDEPENDENT_AMBULATORY_CARE_PROVIDER_SITE_OTHER): Payer: BC Managed Care – PPO

## 2021-09-08 ENCOUNTER — Other Ambulatory Visit: Payer: Self-pay

## 2021-09-08 ENCOUNTER — Encounter: Payer: Self-pay | Admitting: Family Medicine

## 2021-09-08 DIAGNOSIS — I10 Essential (primary) hypertension: Secondary | ICD-10-CM

## 2021-09-08 DIAGNOSIS — E559 Vitamin D deficiency, unspecified: Secondary | ICD-10-CM | POA: Diagnosis not present

## 2021-09-08 DIAGNOSIS — R7303 Prediabetes: Secondary | ICD-10-CM

## 2021-09-08 DIAGNOSIS — D649 Anemia, unspecified: Secondary | ICD-10-CM | POA: Diagnosis not present

## 2021-09-08 DIAGNOSIS — Z1159 Encounter for screening for other viral diseases: Secondary | ICD-10-CM | POA: Diagnosis not present

## 2021-09-09 LAB — CBC WITH DIFFERENTIAL/PLATELET
Basophils Absolute: 0.1 10*3/uL (ref 0.0–0.1)
Basophils Relative: 0.9 % (ref 0.0–3.0)
Eosinophils Absolute: 0.2 10*3/uL (ref 0.0–0.7)
Eosinophils Relative: 2.2 % (ref 0.0–5.0)
HCT: 40.4 % (ref 36.0–46.0)
Hemoglobin: 13.2 g/dL (ref 12.0–15.0)
Lymphocytes Relative: 34.5 % (ref 12.0–46.0)
Lymphs Abs: 3.2 10*3/uL (ref 0.7–4.0)
MCHC: 32.7 g/dL (ref 30.0–36.0)
MCV: 80.3 fl (ref 78.0–100.0)
Monocytes Absolute: 0.8 10*3/uL (ref 0.1–1.0)
Monocytes Relative: 8.1 % (ref 3.0–12.0)
Neutro Abs: 5.1 10*3/uL (ref 1.4–7.7)
Neutrophils Relative %: 54.3 % (ref 43.0–77.0)
Platelets: 323 10*3/uL (ref 150.0–400.0)
RBC: 5.03 Mil/uL (ref 3.87–5.11)
RDW: 13.6 % (ref 11.5–15.5)
WBC: 9.4 10*3/uL (ref 4.0–10.5)

## 2021-09-09 LAB — COMPREHENSIVE METABOLIC PANEL
ALT: 19 U/L (ref 0–35)
AST: 16 U/L (ref 0–37)
Albumin: 4 g/dL (ref 3.5–5.2)
Alkaline Phosphatase: 60 U/L (ref 39–117)
BUN: 19 mg/dL (ref 6–23)
CO2: 28 mEq/L (ref 19–32)
Calcium: 9.2 mg/dL (ref 8.4–10.5)
Chloride: 104 mEq/L (ref 96–112)
Creatinine, Ser: 1 mg/dL (ref 0.40–1.20)
GFR: 70.19 mL/min (ref >60.00)
Glucose, Bld: 97 mg/dL (ref 70–99)
Potassium: 4.4 mEq/L (ref 3.5–5.1)
Sodium: 138 mEq/L (ref 135–145)
Total Bilirubin: 0.3 mg/dL (ref 0.2–1.2)
Total Protein: 7 g/dL (ref 6.0–8.3)

## 2021-09-09 LAB — MICROALBUMIN / CREATININE URINE RATIO
Creatinine,U: 180.7 mg/dL
Microalb Creat Ratio: 0.7 mg/g (ref 0.0–30.0)
Microalb, Ur: 1.2 mg/dL (ref 0.0–1.9)

## 2021-09-09 LAB — HEPATITIS C ANTIBODY
Hepatitis C Ab: NONREACTIVE
SIGNAL TO CUT-OFF: <0.02 (ref <1.00)

## 2021-09-09 LAB — HEMOGLOBIN A1C: Hgb A1c MFr Bld: 6.8 % — ABNORMAL HIGH (ref 4.6–6.5)

## 2021-09-09 LAB — VITAMIN D 25 HYDROXY (VIT D DEFICIENCY, FRACTURES): VITD: 18.99 ng/mL — ABNORMAL LOW (ref 30.00–100.00)

## 2021-09-09 LAB — LIPID PANEL
Cholesterol: 155 mg/dL (ref 0–200)
HDL: 45.5 mg/dL (ref >39.00)
LDL Cholesterol: 77 mg/dL (ref 0–99)
NonHDL: 109.34
Total CHOL/HDL Ratio: 3
Triglycerides: 164 mg/dL — ABNORMAL HIGH (ref 0.0–149.0)
VLDL: 32.8 mg/dL (ref 0.0–40.0)

## 2021-09-11 ENCOUNTER — Other Ambulatory Visit: Payer: Self-pay | Admitting: Family Medicine

## 2021-09-13 DIAGNOSIS — F410 Panic disorder [episodic paroxysmal anxiety] without agoraphobia: Secondary | ICD-10-CM | POA: Diagnosis not present

## 2021-09-13 DIAGNOSIS — F331 Major depressive disorder, recurrent, moderate: Secondary | ICD-10-CM | POA: Diagnosis not present

## 2021-09-15 ENCOUNTER — Encounter: Payer: Self-pay | Admitting: Family Medicine

## 2021-09-15 ENCOUNTER — Ambulatory Visit (INDEPENDENT_AMBULATORY_CARE_PROVIDER_SITE_OTHER): Payer: BC Managed Care – PPO | Admitting: Family Medicine

## 2021-09-15 ENCOUNTER — Other Ambulatory Visit: Payer: Self-pay

## 2021-09-15 VITALS — BP 140/102 | HR 91 | Temp 97.6°F | Ht 69.5 in | Wt 274.4 lb

## 2021-09-15 DIAGNOSIS — M5416 Radiculopathy, lumbar region: Secondary | ICD-10-CM | POA: Diagnosis not present

## 2021-09-15 DIAGNOSIS — Z8669 Personal history of other diseases of the nervous system and sense organs: Secondary | ICD-10-CM

## 2021-09-15 DIAGNOSIS — M25552 Pain in left hip: Secondary | ICD-10-CM

## 2021-09-15 DIAGNOSIS — Z0001 Encounter for general adult medical examination with abnormal findings: Secondary | ICD-10-CM

## 2021-09-15 DIAGNOSIS — E1169 Type 2 diabetes mellitus with other specified complication: Secondary | ICD-10-CM

## 2021-09-15 DIAGNOSIS — M5136 Other intervertebral disc degeneration, lumbar region: Secondary | ICD-10-CM

## 2021-09-15 DIAGNOSIS — I10 Essential (primary) hypertension: Secondary | ICD-10-CM

## 2021-09-15 DIAGNOSIS — F419 Anxiety disorder, unspecified: Secondary | ICD-10-CM

## 2021-09-15 DIAGNOSIS — E559 Vitamin D deficiency, unspecified: Secondary | ICD-10-CM

## 2021-09-15 DIAGNOSIS — F32A Depression, unspecified: Secondary | ICD-10-CM

## 2021-09-15 MED ORDER — KETOROLAC TROMETHAMINE 30 MG/ML IJ SOLN
30.0000 mg | Freq: Once | INTRAMUSCULAR | Status: AC
  Administered 2021-09-15: 30 mg via INTRAMUSCULAR

## 2021-09-15 MED ORDER — METFORMIN HCL 500 MG PO TABS: 500.0000 mg | ORAL_TABLET | Freq: Two times a day (BID) | ORAL | 3 refills | Status: DC

## 2021-09-15 MED ORDER — TRAMADOL HCL 50 MG PO TABS: 50.0000 mg | ORAL_TABLET | Freq: Three times a day (TID) | ORAL | 0 refills | Status: DC | PRN

## 2021-09-15 MED ORDER — VITAMIN D3 50 MCG (2000 UT) PO CAPS: 2000.0000 [IU] | ORAL_CAPSULE | Freq: Every day | ORAL | Status: DC

## 2021-09-15 MED ORDER — METFORMIN HCL 500 MG PO TABS: 500.0000 mg | ORAL_TABLET | Freq: Every day | ORAL | 3 refills | Status: DC

## 2021-09-15 MED ORDER — NAPROXEN 500 MG PO TABS: ORAL_TABLET | ORAL | 0 refills | Status: DC

## 2021-09-15 MED ORDER — HYDROXYZINE HCL 25 MG PO TABS: 12.5000 mg | ORAL_TABLET | Freq: Two times a day (BID) | ORAL | 0 refills | Status: DC | PRN

## 2021-09-15 MED ORDER — OZEMPIC (0.25 OR 0.5 MG/DOSE) 2 MG/1.5ML ~~LOC~~ SOPN: PEN_INJECTOR | SUBCUTANEOUS | 3 refills | Status: AC

## 2021-09-15 NOTE — Patient Instructions (Addendum)
A1c returned in diabetes range. Either increase metformin to 500mg  twice daily or price out ozempic weekly injection - 0.25mg  weekly for 2 weeks then increase to 0.5mg  weekly.  ?I do think you have pinched nerve in the left leg causing symptoms - treat with toradol injection today, may take tylenol 1000mg  tonight, then tomorrow start naprosyn 500mg  twice daily with food for 1 week then as needed. May use tramadol 50mg  twice daily as needed for breakthrough pain.  ?If worsening pain despite this, or worsening weakness, let us know.  ?Try hydroxyzine 25mg  twice daily as needed for anxiety/sleep.  ?Start vitamin D 2000 units daily.  ?Return in 6-8 weeks for follow up visit.  ? ?Health Maintenance, Female ?Adopting a healthy lifestyle and getting preventive care are important in promoting health and wellness. Ask your health care provider about: ?The right schedule for you to have regular tests and exams. ?Things you can do on your own to prevent diseases and keep yourself healthy. ?What should I know about diet, weight, and exercise? ?Eat a healthy diet ? ?Eat a diet that includes plenty of vegetables, fruits, low-fat dairy products, and lean protein. ?Do not eat a lot of foods that are high in solid fats, added sugars, or sodium. ?Maintain a healthy weight ?Body mass index (BMI) is used to identify weight problems. It estimates body fat based on height and weight. Your health care provider can help determine your BMI and help you achieve or maintain a healthy weight. ?Get regular exercise ?Get regular exercise. This is one of the most important things you can do for your health. Most adults should: ?Exercise for at least 150 minutes each week. The exercise should increase your heart rate and make you sweat (moderate-intensity exercise). ?Do strengthening exercises at least twice a week. This is in addition to the moderate-intensity exercise. ?Spend less time sitting. Even light physical activity can be  beneficial. ?Watch cholesterol and blood lipids ?Have your blood tested for lipids and cholesterol at 41 years of age, then have this test every 5 years. ?Have your cholesterol levels checked more often if: ?Your lipid or cholesterol levels are high. ?You are older than 41 years of age. ?You are at high risk for heart disease. ?What should I know about cancer screening? ?Depending on your health history and family history, you may need to have cancer screening at various ages. This may include screening for: ?Breast cancer. ?Cervical cancer. ?Colorectal cancer. ?Skin cancer. ?Lung cancer. ?What should I know about heart disease, diabetes, and high blood pressure? ?Blood pressure and heart disease ?High blood pressure causes heart disease and increases the risk of stroke. This is more likely to develop in people who have high blood pressure readings or are overweight. ?Have your blood pressure checked: ?Every 3-5 years if you are 24-55 years of age. ?Every year if you are 57 years old or older. ?Diabetes ?Have regular diabetes screenings. This checks your fasting blood sugar level. Have the screening done: ?Once every three years after age 65 if you are at a normal weight and have a low risk for diabetes. ?More often and at a younger age if you are overweight or have a high risk for diabetes. ?What should I know about preventing infection? ?Hepatitis B ?If you have a higher risk for hepatitis B, you should be screened for this virus. Talk with your health care provider to find out if you are at risk for hepatitis B infection. ?Hepatitis C ?Testing is recommended for: ?Everyone  born from [birth year] through 1965. ?Anyone with known risk factors for hepatitis C. ?Sexually transmitted infections (STIs) ?Get screened for STIs, including gonorrhea and chlamydia, if: ?You are sexually active and are younger than 41 years of age. ?You are older than 41 years of age and your health care provider tells you that you are at risk for  this type of infection. ?Your sexual activity has changed since you were last screened, and you are at increased risk for chlamydia or gonorrhea. Ask your health care provider if you are at risk. ?Ask your health care provider about whether you are at high risk for HIV. Your health care provider may recommend a prescription medicine to help prevent HIV infection. If you choose to take medicine to prevent HIV, you should first get tested for HIV. You should then be tested every 3 months for as long as you are taking the medicine. ?Pregnancy ?If you are about to stop having your period (premenopausal) and you may become pregnant, seek counseling before you get pregnant. ?Take 400 to 800 micrograms (mcg) of folic acid every day if you become pregnant. ?Ask for birth control (contraception) if you want to prevent pregnancy. ?Osteoporosis and menopause ?Osteoporosis is a disease in which the bones lose minerals and strength with aging. This can result in bone fractures. If you are 26 years old or older, or if you are at risk for osteoporosis and fractures, ask your health care provider if you should: ?Be screened for bone loss. ?Take a calcium or vitamin D supplement to lower your risk of fractures. ?Be given hormone replacement therapy (HRT) to treat symptoms of menopause. ?Follow these instructions at home: ?Alcohol use ?Do not drink alcohol if: ?Your health care provider tells you not to drink. ?You are pregnant, may be pregnant, or are planning to become pregnant. ?If you drink alcohol: ?Limit how much you have to: ?0-1 drink a day. ?Know how much alcohol is in your drink. In the [place] one drink equals one 12 oz bottle of beer (355 mL), one 5 oz glass of wine (148 mL), or one 1? oz glass of hard liquor (44 mL). ?Lifestyle ?Do not use any products that contain nicotine or tobacco. These products include cigarettes, chewing tobacco, and vaping devices, such as e-cigarettes. If you need help quitting, ask your health  care provider. ?Do not use street drugs. ?Do not share needles. ?Ask your health care provider for help if you need support or information about quitting drugs. ?General instructions ?Schedule regular health, dental, and eye exams. ?Stay current with your vaccines. ?Tell your health care provider if: ?You often feel depressed. ?You have ever been abused or do not feel safe at home. ?Summary ?Adopting a healthy lifestyle and getting preventive care are important in promoting health and wellness. ?Follow your health care provider's instructions about healthy diet, exercising, and getting tested or screened for diseases. ?Follow your health care provider's instructions on monitoring your cholesterol and blood pressure. ?This information is not intended to replace advice given to you by your health care provider. Make sure you discuss any questions you have with your health care provider. ?Document Revised: 11/09/2020 Document Reviewed: 11/09/2020 ?Elsevier Patient Education ? [employer]. ? ?

## 2021-09-15 NOTE — Progress Notes (Signed)
? ? Patient ID: Laura Calderon Woodson, female    DOB: 05/03/1981, 41 y.o.   MRN: [id number] ? ?This visit was conducted in person. ? ?BP (!) 140/102 (BP Location: Right Arm, Cuff Size: Large)   Pulse 91   Temp 97.6 ?F (36.4 ?C) (Temporal)   Ht 5' 9.5" (1.765 m)   Wt 274 lb 6 oz (124.5 kg)   LMP 08/19/2021   SpO2 99%   BMI 39.94 kg/m?   ? ?CC: CPE ?Subjective:  ? ?HPI: ?Laura Calderon Kicklighter is a 41 y.o. female presenting on 09/15/2021 for Annual Exam and Leg Pain (C/o L leg pain. Described as sharp, shooting pain starting in left buttock radiating down leg.  Tried old gabapentin rx and Tylenol. ) ? ? 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  ? ?COVID infection 02/2021, 06/2021 - symptoms fully resolved.  ?Seen for exertional dyspnea 04/2021 - in setting of increased air travel and recent COVID infection, CTA chest reassuringly normal. Dyspnea has significantly improved, possibly attributes to weight gain.  ? ?HTN - not regularly checking BP at home - she regularly takes amlodipine 5mg .  ? ?1 wk h/o L leg pain with radiation from buttock down leg, progressively worsening - has been taking gabapentin and ibuprofen 600mg  once daily and tylenol 1000mg  and gabapentin 200mg  at night. Pain described as sharp pain, notes some weakness to left leg due to pain.  ?H/o known L4/5 DDD, has previously had discogram and epidural steroid injections.  ? ?H/o depression - now with anxiety attacks (dyspnea, dizziness, palpitation, chest pain followed by headache and fatigue - triggered by anxiety) which started 3 wks ago. Continues seeing therapist regularly. Notes increased stress with upcoming move.  ? ?Preventative: ?Well woman through [facility] Daniela Paul CNM [phone]). Normal paps in the past. IUD removed 2022 - husband had vasectomy  ?Mammogram 2022 through GYN - WNL per pt  ?Flu shot yearly ?COVID vaccine [employer]- Pfizer 09/2019 x2, booster 06/2020 ?Tdap 2013  ?Seat belt use discussed ?Sunscreen use  discussed. No changing moles on skin ?Sleep - averaging 4-5 hours/night ?Non smoker ?Alcohol - social  ?Dentist q6 mo  ?Eye exam yearly ? 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   ? ?Relevant past medical, surgical, family and social history reviewed and updated as indicated. Interim medical history since our last visit reviewed. ?Allergies and medications reviewed and updated. ?Outpatient Medications Prior to Visit  ?Medication Sig Dispense Refill  ? amLODipine (NORVASC) 5 MG tablet TAKE 1 TABLET (5 MG TOTAL) BY MOUTH DAILY. 90 tablet 0  ? metFORMIN (GLUCOPHAGE) 500 MG tablet Take 1 tablet (500 mg total) by mouth daily with breakfast. 90 tablet 1  ? chlorpheniramine-HYDROcodone (TUSSIONEX PENNKINETIC ER) 10-8 MG/5ML SUER Take 5 mLs by mouth every 12 (twelve) hours as needed for cough. 115 mL 0  ? Cholecalciferol (VITAMIN D3) 250 MCG (10000 UT) TABS See admin instructions. (Patient not taking: Reported on 06/16/2021)    ? Melatonin Gummies 2.5 MG CHEW See admin instructions. As needed    ? predniSONE (DELTASONE) 20 MG tablet Take two tablets daily for 3 days followed by one tablet daily for 4 days 10 tablet 0  ? ?No facility-administered medications prior to visit.  ?  ? ?Per HPI unless specifically indicated  in ROS section below ?Review of Systems  ?Constitutional:  Negative for activity change, appetite change, chills, fatigue, fever and unexpected weight change.  ?HENT:  Negative for hearing loss.   ?Eyes:  Negative for visual disturbance.  ?Respiratory:  Negative for cough, chest tightness, shortness of breath and wheezing.   ?Cardiovascular:  Negative for chest pain, palpitations and leg swelling.  ?Gastrointestinal:  Negative for abdominal distention, abdominal pain, blood in stool, constipation, diarrhea, nausea and  vomiting.  ?     Loose stools to metformin  ?Genitourinary:  Negative for difficulty urinating and hematuria.  ?Musculoskeletal:  Negative for arthralgias, myalgias and neck pain.  ?Skin:  Negative for rash.  ?Neurological:  Positive for headaches. Negative for dizziness, seizures and syncope.  ?Hematological:  Negative for adenopathy. Does not bruise/bleed easily.  ?Psychiatric/Behavioral:  Positive for dysphoric mood. The patient is nervous/anxious.   ? ?Objective:  ?BP (!) 140/102 (BP Location: Right Arm, Cuff Size: Large)   Pulse 91   Temp 97.6 ?F (36.4 ?C) (Temporal)   Ht 5' 9.5" (1.765 m)   Wt 274 lb 6 oz (124.5 kg)   LMP 08/19/2021   SpO2 99%   BMI 39.94 kg/m?   ?Wt Readings from Last 3 Encounters:  ?09/15/21 274 lb 6 oz (124.5 kg)  ?06/16/21 270 lb (122.5 kg)  ?05/03/21 271 lb 3 oz (123 kg)  ?  ?  ?Physical Exam ?Vitals and nursing note reviewed.  ?Constitutional:   ?   Appearance: Normal appearance. She is not ill-appearing.  ?HENT:  ?   Head: Normocephalic and atraumatic.  ?   Right Ear: Tympanic membrane, ear canal and external ear normal. There is no impacted cerumen.  ?   Left Ear: Tympanic membrane, ear canal and external ear normal. There is no impacted cerumen.  ?Eyes:  ?   General:     ?   Right eye: No discharge.     ?   Left eye: No discharge.  ?   Extraocular Movements: Extraocular movements intact.  ?   Conjunctiva/sclera: Conjunctivae normal.  ?   Pupils: Pupils are equal, round, and reactive to light.  ?Neck:  ?   Thyroid: No thyroid mass or thyromegaly.  ?Cardiovascular:  ?   Rate and Rhythm: Normal rate and regular rhythm.  ?   Pulses: Normal pulses.  ?   Heart sounds: Normal heart sounds. No murmur heard. ?Pulmonary:  ?   Effort: Pulmonary effort is normal. No respiratory distress.  ?   Breath sounds: Normal breath sounds. No wheezing, rhonchi or rales.  ?Abdominal:  ?   General: Bowel sounds are normal. There is no distension.  ?   Palpations: Abdomen is soft. There is no mass.  ?    Tenderness: There is no abdominal tenderness. There is no guarding or rebound.  ?   Hernia: No hernia is present.  ?Musculoskeletal:  ?   Cervical back: Normal range of motion and neck supple. No rigidity.  ?   Right lower leg: No edema.  ?   Left lower leg: No edema.  ?   Comments:  ?No pain midline spine ?No paraspinous mm tenderness ?++SLR bilaterally - reproduces L lumbar radiculitis ?No pain with int/ext rotation at hip ?Pain at left sciatic notch  ?No pain at SIJ, GTB bilaterally   ?Lymphadenopathy:  ?   Cervical: No cervical adenopathy.  ?Skin: ?   General: Skin is warm and dry.  ?   Findings: No rash.  ?Neurological:  ?  General: No focal deficit present.  ?   Mental Status: She is alert. Mental status is at baseline.  ?   Sensory: Sensation is intact.  ?   Comments:  ?Diminished DTR reflexes BLE ?4/5 strength to left hip flexors and L foot dorsiflexion ?5/5 strength BLE otherwise  ?Psychiatric:     ?   Mood and Affect: Mood normal.     ?   Behavior: Behavior normal.  ? ?   ?Results for orders placed or performed in visit on 09/08/21  ?Hepatitis C antibody  ?Result Value Ref Range  ? Hepatitis C Ab NON-REACTIVE NON-REACTIVE  ? SIGNAL TO CUT-OFF <0.02 <1.00  ?Microalbumin / creatinine urine ratio  ?Result Value Ref Range  ? Microalb, Ur 1.2 0.0 - 1.9 mg/dL  ? Creatinine,U 180.7 mg/dL  ? Microalb Creat Ratio 0.7 0.0 - 30.0 mg/g  ?VITAMIN Calderon 25 Hydroxy (Vit-Calderon Deficiency, Fractures)  ?Result Value Ref Range  ? VITD 18.99 (L) 30.00 - 100.00 ng/mL  ?Hemoglobin A1c  ?Result Value Ref Range  ? Hgb A1c MFr Bld 6.8 (H) 4.6 - 6.5 %  ?Lipid panel  ?Result Value Ref Range  ? Cholesterol 155 0 - 200 mg/dL  ? Triglycerides 164.0 (H) 0.0 - 149.0 mg/dL  ? HDL 45.50 >39.00 mg/dL  ? VLDL 32.8 0.0 - 40.0 mg/dL  ? LDL Cholesterol 77 0 - 99 mg/dL  ? Total CHOL/HDL Ratio 3   ? NonHDL 109.34   ?CBC with Differential/Platelet  ?Result Value Ref Range  ? WBC 9.4 4.0 - 10.5 K/uL  ? RBC 5.03 3.87 - 5.11 Mil/uL  ? Hemoglobin 13.2 12.0 -  15.0 g/dL  ? HCT 40.4 36.0 - 46.0 %  ? MCV 80.3 78.0 - 100.0 fl  ? MCHC 32.7 30.0 - 36.0 g/dL  ? RDW 13.6 11.5 - 15.5 %  ? Platelets 323.0 150.0 - 400.0 K/uL  ? Neutrophils Relative % 54.3 43.0 - 77.0 %  ? Lymphocytes Re

## 2021-09-15 NOTE — Assessment & Plan Note (Signed)
Preventative protocols reviewed and updated unless pt declined. Discussed healthy diet and lifestyle.  

## 2021-09-16 ENCOUNTER — Ambulatory Visit (INDEPENDENT_AMBULATORY_CARE_PROVIDER_SITE_OTHER)
Admission: RE | Admit: 2021-09-16 | Discharge: 2021-09-16 | Disposition: A | Discharge status: Home / Self Care | Payer: BC Managed Care – PPO | Source: Ambulatory Visit | Attending: Family Medicine | Admitting: Family Medicine

## 2021-09-16 ENCOUNTER — Encounter: Payer: Self-pay | Admitting: Family Medicine

## 2021-09-16 DIAGNOSIS — M5416 Radiculopathy, lumbar region: Secondary | ICD-10-CM

## 2021-09-16 DIAGNOSIS — M545 Low back pain, unspecified: Secondary | ICD-10-CM | POA: Diagnosis not present

## 2021-09-16 MED ORDER — PREDNISONE 20 MG PO TABS: ORAL_TABLET | ORAL | 0 refills | Status: DC

## 2021-09-16 MED ORDER — OXYCODONE-ACETAMINOPHEN 5-325 MG PO TABS: 1.0000 | ORAL_TABLET | Freq: Three times a day (TID) | ORAL | 0 refills | Status: DC | PRN

## 2021-09-16 MED ORDER — AMLODIPINE BESYLATE 5 MG PO TABS: 5.0000 mg | ORAL_TABLET | Freq: Every day | ORAL | 3 refills | Status: AC

## 2021-09-16 NOTE — Assessment & Plan Note (Signed)
Longstanding depression/anxiety seeing therapist regularly. Notes worsening anxiety attacks as per HPI. Discussed option of medication - both daily preventative and PRN. Will trial hydroxyzine PRN, watching for sedation side effect.  ?

## 2021-09-16 NOTE — Assessment & Plan Note (Signed)
Chronic, BP elevated today due to acute back pain as per below. No med changes made- continue amlodipine 5mg  daily.  ?

## 2021-09-16 NOTE — Assessment & Plan Note (Signed)
1 week of left lumbar radiculitis symptoms with associated left leg weakness (presumed pain related) and bilateral decreased DTRs. Rx naprosyn, tramadol, IM toradol 30mg  today.  ?In h/o DDD and noted leg weakness, check lumbar films and consider MRI if no benefit with above.  ? ?ADDENDUM ==> worsening pain, will change tramadol to percocets and naprosyn to prednisone taper.  ?

## 2021-09-16 NOTE — Assessment & Plan Note (Addendum)
New diagnosis. Metformin started 05/2021. A1c trended up to 6.8% despite metformin. Discussed options - increasing metformin to bid vs trial GLP1RA. Reviewed benefits and mechanism of action of injectable weekly GLP1RA as well as risks/side effects to monitor for including but not limited to nausea, constipation, diarrhea, pancreatitis. No fmhx thyroid cancer. She will price out ozempic. RTC 6 wks f/u DM.  ?

## 2021-09-16 NOTE — Assessment & Plan Note (Signed)
rec start vit D 2000 IU daily.  

## 2021-09-16 NOTE — Assessment & Plan Note (Signed)
?  h/o this.  

## 2021-09-16 NOTE — Assessment & Plan Note (Signed)
Encouraged healthy diet and lifestyle choices to affect sustainable weight loss.  ?

## 2021-09-17 MED ORDER — CYCLOBENZAPRINE HCL 10 MG PO TABS: 5.0000 mg | ORAL_TABLET | Freq: Two times a day (BID) | ORAL | 0 refills | Status: DC | PRN

## 2021-09-17 NOTE — Addendum Note (Signed)
Addended by: Eustaquio Boyden on: 09/17/2021 11:51 AM ? ? Modules accepted: Orders ? ?

## 2021-09-20 ENCOUNTER — Encounter: Payer: Self-pay | Admitting: Family Medicine

## 2021-09-20 DIAGNOSIS — M5416 Radiculopathy, lumbar region: Secondary | ICD-10-CM

## 2021-09-22 ENCOUNTER — Other Ambulatory Visit: Payer: Self-pay | Admitting: Family Medicine

## 2021-09-22 DIAGNOSIS — M5442 Lumbago with sciatica, left side: Secondary | ICD-10-CM | POA: Diagnosis not present

## 2021-09-22 MED ORDER — OXYCODONE-ACETAMINOPHEN 5-325 MG PO TABS: 1.0000 | ORAL_TABLET | Freq: Three times a day (TID) | ORAL | 0 refills | Status: DC | PRN

## 2021-09-22 NOTE — Addendum Note (Signed)
Addended by: Eustaquio Boyden on: 09/22/2021 08:44 AM ? ? Modules accepted: Orders ? ?

## 2021-09-22 NOTE — Telephone Encounter (Signed)
Refill request Atarax ?Last refill 09/15/21 #30 ?Last office visit 09/15/21 #30 ?

## 2021-09-23 NOTE — Telephone Encounter (Signed)
ERx 

## 2021-09-24 ENCOUNTER — Telehealth: Payer: Self-pay

## 2021-09-24 NOTE — Telephone Encounter (Signed)
Please work on [employer] PA  ?

## 2021-09-24 NOTE — Telephone Encounter (Signed)
Submitted PA.  (See 09/24/21 phn note) ?

## 2021-09-24 NOTE — Telephone Encounter (Signed)
Received faxed PA request from [facility] for Ozempic 0.25 or 0.5 mg dose, 2 mg/1.5 mL SOPN. ? ?Submitted PA; key:  [id number].  Decision pending.  ?

## 2021-09-28 ENCOUNTER — Other Ambulatory Visit: Payer: Self-pay | Admitting: Family Medicine

## 2021-09-28 MED ORDER — GABAPENTIN 100 MG PO CAPS: 100.0000 mg | ORAL_CAPSULE | Freq: Two times a day (BID) | ORAL | 1 refills | Status: AC | PRN

## 2021-09-28 NOTE — Telephone Encounter (Signed)
Placed form at front office.  

## 2021-09-28 NOTE — Telephone Encounter (Signed)
Handicap placard application filled out and given to [place].  ?

## 2021-09-28 NOTE — Addendum Note (Signed)
Addended by: Eustaquio Boyden on: 09/28/2021 08:17 AM ? ? Modules accepted: Orders ? ?

## 2021-09-29 ENCOUNTER — Other Ambulatory Visit: Payer: Self-pay | Admitting: Family Medicine

## 2021-09-29 MED ORDER — OXYCODONE-ACETAMINOPHEN 5-325 MG PO TABS: 1.0000 | ORAL_TABLET | Freq: Three times a day (TID) | ORAL | 0 refills | Status: DC | PRN

## 2021-09-29 NOTE — Telephone Encounter (Signed)
ERx 

## 2021-09-29 NOTE — Telephone Encounter (Signed)
Is this okay to refill? 

## 2021-09-30 NOTE — Telephone Encounter (Signed)
Per 09/15/21 OV note, pt is on trial of med. ?

## 2021-10-01 ENCOUNTER — Telehealth: Payer: Self-pay | Admitting: Family Medicine

## 2021-10-01 NOTE — Telephone Encounter (Signed)
Yes, now I remember placing it up there.  Notified pt via MyChart.  ?

## 2021-10-01 NOTE — Telephone Encounter (Signed)
Placed form in Dr. Timoteo Expose box.   ? ?Pt requests a MyChart message when form is ready to pick up.  ?

## 2021-10-01 NOTE — Telephone Encounter (Signed)
Pt walk I stated she needs a temp handicap paperwork  ?

## 2021-10-01 NOTE — Telephone Encounter (Signed)
Form was placed up front earlier this week for patient to pick up. It should be up front.  ?

## 2021-10-04 DIAGNOSIS — M545 Low back pain, unspecified: Secondary | ICD-10-CM | POA: Diagnosis not present

## 2021-10-07 ENCOUNTER — Other Ambulatory Visit: Payer: Self-pay | Admitting: Family Medicine

## 2021-10-07 DIAGNOSIS — M5442 Lumbago with sciatica, left side: Secondary | ICD-10-CM | POA: Diagnosis not present

## 2021-10-07 NOTE — Telephone Encounter (Signed)
Naproxen ?Last filled:  09/15/21, #50 ?Last OV:  09/15/21, CPE; L lumbar pain ?Next OV:  11/10/21, 6 wk DM f/u ?

## 2021-10-08 NOTE — Telephone Encounter (Signed)
Refill request for oxyCODONE-acetaminophen (PERCOCET) 5-325 MG tablet ? ?LOV - 09/15/21 ?Next OV - 11/10/21 ?Last refill - 09/29/21 #15/0 ? ?

## 2021-10-09 ENCOUNTER — Encounter: Payer: Self-pay | Admitting: Family Medicine

## 2021-10-09 MED ORDER — OXYCODONE-ACETAMINOPHEN 5-325 MG PO TABS: 1.0000 | ORAL_TABLET | Freq: Three times a day (TID) | ORAL | 0 refills | Status: DC | PRN

## 2021-10-09 NOTE — Telephone Encounter (Signed)
Oxycodone refilled. How is she doing, what did neurology say?  ?

## 2021-10-11 MED ORDER — OXYCODONE-ACETAMINOPHEN 5-325 MG PO TABS: 1.0000 | ORAL_TABLET | Freq: Three times a day (TID) | ORAL | 0 refills | Status: DC | PRN

## 2021-10-11 NOTE — Telephone Encounter (Signed)
ERx oxycodone to [facility] ?

## 2021-10-11 NOTE — Telephone Encounter (Signed)
[facility] Night - Client ?TELEPHONE ADVICE RECORD ?[facility]? ?Patient ?Name: ?Laura Calderon ?Gender: Female ?DOB: 1981-01-10 ?Age: 41 Y 2 M 2 D ?Return ?Phone ?Number: ?[id number] ?(Primary) ?Address: ?City/ ?State/ ?Zip: ?[place] ?[address] ?Client [place] Night - Client ?Client Site [place] ?Provider Ria Bush - MD ?Contact Type Call ?Who Is Calling Patient / Member / Family / Caregiver ?Call Type Triage / Clinical ?Relationship To Patient Self ?Return Phone Number [phone] (Primary) ?Chief Complaint Leg Pain ?Reason for Call Medication Question / Request ?Initial Comment Caller states her doctor called in a prescription ?to her pharmacy, but the pharmacy said they are ?out of stock. They will be leaving town at 5 in ?the morning. There is another pharmacy that has ?the med, but she needs a new script for it. It is ?for Percocet. Dr. Danise Mina called it in today. The ?[facility] might have some [facility] [facility] and they close ?at 6. CBWN: Caller states that she is having back ?and leg pain. ?Translation No ?Nurse Assessment ?Nurse: Waymond Cera, RN, Benjamine Mola Date/Time (Eastern Time): 10/09/2021 5:42:07 PM ?Confirm and document reason for call. If ?symptomatic, describe symptoms. ?---Caller states that she is having back and leg pain. ?States that the pharmacy is out of her percocet and is ?wanting med transferred to a different pharmacy. ?Does the patient have any new or worsening ?symptoms? ---Yes ?Will a triage be completed? ---Yes ?Related visit to physician within the last 2 weeks? ---Yes ?Does the PT have any chronic conditions? (i.e. ?diabetes, asthma, this includes High risk factors for ?pregnancy, etc.) ?---No ?Is the patient pregnant or possibly pregnant? (Ask ?all females between the ages of 27-55) ---No ?Is this a behavioral health or substance abuse call? ---No ?Nurse:  Waymond Cera, RN, Benjamine Mola Date/Time (Eastern Time): 10/09/2021 5:44:27 PM ?Please select the assessment type ---Request for controlled medication refill ?Additional Documentation ?---Caller states that her Percocet needs to be called to a ?different pharmacy as the one it was called in to it was ?out of stock. ?PLEASE NOTE: All timestamps contained within this report are represented as [place] Standard Time. ?CONFIDENTIALTY NOTICE: This fax transmission is intended only for the addressee. It contains information that is legally privileged, confidential or ?otherwise protected from use or disclosure. If you are not the intended recipient, you are strictly prohibited from reviewing, disclosing, copying using ?or disseminating any of this information or taking any action in reliance on or regarding this information. If you have received this fax in error, please ?notify us immediately by telephone so that we can arrange for its return to [place]. Phone: [phone], Toll-Free: [phone], Fax: [phone] ?Page: 2 of 3 ?Call Id: [id number] ?Nurse Assessment ?Is there an on-call physician for the client? ---Yes ?Do the client directives specifically allow for paging ?the on-call regarding scheduled drugs? ---No ?Additional Documentation ?---Advised caller to call office back on Monday as ?meds are not dealt with after hours. Caller states that ?it was just called in today, so someone was working ?on it. Advised that I don't know how it was sent in, but ?meds are not dealt with after hours so there is nothing I ?can do at this time point. Verbalizes understanding. ?Guidelines ?Guideline Title Affirmed Question Affirmed Notes Nurse Date/Time (Eastern ?Time) ?Back Pain Weakness of a leg or ?foot (e.g., unable to ?bear weight, dragging ?foot) ?Cantrell, RN, ?Elizabeth ?10/09/2021 5:43:19 PM ?Disp. Time (Eastern ?Time) Disposition Final User ?  10/09/2021 5:38:17 PM Send To Call Back Waiting For Nurse Perla-Benitez, Jocelyn ?10/09/2021  5:45:44 PM Go to ED Now (or PCP triage) Yes Cantrell, RN, Benjamine Mola ?Caller Disagree/Comply Disagree ?Caller Understands Yes ?PreDisposition Did not know what to do ?Care Advice Given Per Guideline ?GO TO ED NOW (OR PCP TRIAGE): * IF NO PCP (PRIMARY CARE PROVIDER) SECOND-LEVEL TRIAGE: You need to be ?seen within the next hour. Go to the [facility] at [address] [place] as soon as you can. ANOTHER ADULT SHOULD ?DRIVE: * It is better and safer if another adult drives instead of you. CARE ADVICE given per Back Pain (Adult) guideline. ?Comments ?User: Laura Calderon, Perla-Benitez Date/Time (Eastern Time): 10/09/2021 5:37:02 PM ?CBWN: Caller states that she is having back pain and leg pain. ?User: Sharol Given, RN Date/Time Eilene Ghazi Time): 10/09/2021 5:52:00 PM ?Caller states that she does not know if she will go be seen. Advised risk of not being seen includes getting worse/ ?loss of mobility. Verbalizes understanding. ?PLEASE NOTE: All timestamps contained within this report are represented as [place] Standard Time. ?CONFIDENTIALTY NOTICE: This fax transmission is intended only for the addressee. It contains information that is legally privileged, confidential or ?otherwise protected from use or disclosure. If you are not the intended recipient, you are strictly prohibited from reviewing, disclosing, copying using ?or disseminating any of this information or taking any action in reliance on or regarding this information. If you have received this fax in error, please ?notify us immediately by telephone so that we can arrange for its return to [place]. Phone: [phone], Toll-Free: [phone], Fax: [phone] ?Page: 3 of 3 ?Call Id: [id number] ?Referrals ?GO TO FACILITY REFUSE ?

## 2021-10-11 NOTE — Telephone Encounter (Signed)
I spoke with pt and she did not get the oxycodone apap 5-325 mg before leaving town and pt said without med the pain level is increasing. Pt request oxycodone apap 5-[facility] [place] [facility] [address]. I spoke with John[facility] at [facility] in [facility] [phone] and he does have oxycodone apap 5-325 mg # 15 in stock. I spoke with Rich Reining at [facility] and pt did not pick up oxycodone recently because [facility] [place] [facility] it on backorder from where they get the med supplied from. Sending note to DR G and LIsa CMA. Pt request cb when rx has been sent in and request done as soon as can due to pain. Pt is appreciative.  ?

## 2021-10-17 ENCOUNTER — Other Ambulatory Visit: Payer: Self-pay | Admitting: Family Medicine

## 2021-10-18 DIAGNOSIS — M5416 Radiculopathy, lumbar region: Secondary | ICD-10-CM | POA: Diagnosis not present

## 2021-10-18 DIAGNOSIS — Z6835 Body mass index (BMI) 35.0-35.9, adult: Secondary | ICD-10-CM | POA: Diagnosis not present

## 2021-10-18 NOTE — Telephone Encounter (Signed)
Refill request Oxycodone ?Last refill 10/11/21 #15 ?Last office visit 09/15/21  ?Upcoming appointment 11/10/21 ?

## 2021-10-19 MED ORDER — OXYCODONE-ACETAMINOPHEN 5-325 MG PO TABS: 1.0000 | ORAL_TABLET | Freq: Three times a day (TID) | ORAL | 0 refills | Status: DC | PRN

## 2021-10-19 NOTE — Telephone Encounter (Signed)
ERx 

## 2021-10-20 MED ORDER — OXYCODONE-ACETAMINOPHEN 5-325 MG PO TABS: 1.0000 | ORAL_TABLET | Freq: Three times a day (TID) | ORAL | 0 refills | Status: DC | PRN

## 2021-10-20 NOTE — Addendum Note (Signed)
Addended by: Ria Bush on: 10/20/2021 05:05 PM ? ? Modules accepted: Orders ? ?

## 2021-10-20 NOTE — Telephone Encounter (Signed)
ERx percocet Rx. ?[place] CSRS reviewed.  ?

## 2021-10-25 DIAGNOSIS — F410 Panic disorder [episodic paroxysmal anxiety] without agoraphobia: Secondary | ICD-10-CM | POA: Diagnosis not present

## 2021-10-25 DIAGNOSIS — F331 Major depressive disorder, recurrent, moderate: Secondary | ICD-10-CM | POA: Diagnosis not present

## 2021-11-08 DIAGNOSIS — F410 Panic disorder [episodic paroxysmal anxiety] without agoraphobia: Secondary | ICD-10-CM | POA: Diagnosis not present

## 2021-11-08 DIAGNOSIS — F331 Major depressive disorder, recurrent, moderate: Secondary | ICD-10-CM | POA: Diagnosis not present

## 2021-11-09 DIAGNOSIS — M5416 Radiculopathy, lumbar region: Secondary | ICD-10-CM | POA: Diagnosis not present

## 2021-11-09 MED ORDER — OXYCODONE-ACETAMINOPHEN 5-325 MG PO TABS: 1.0000 | ORAL_TABLET | Freq: Three times a day (TID) | ORAL | 0 refills | Status: DC | PRN

## 2021-11-09 NOTE — Addendum Note (Signed)
Addended by: Eustaquio Boyden on: 11/09/2021 03:12 PM ? ? Modules accepted: Orders ? ?

## 2021-11-10 ENCOUNTER — Ambulatory Visit: Payer: BC Managed Care – PPO | Admitting: Family Medicine

## 2021-11-12 ENCOUNTER — Ambulatory Visit: Payer: BC Managed Care – PPO | Admitting: Family Medicine

## 2021-11-12 ENCOUNTER — Encounter: Payer: Self-pay | Admitting: Family Medicine

## 2021-11-12 VITALS — BP 136/86 | HR 74 | Temp 98.2°F | Ht 69.5 in | Wt 269.1 lb

## 2021-11-12 DIAGNOSIS — E1169 Type 2 diabetes mellitus with other specified complication: Secondary | ICD-10-CM | POA: Diagnosis not present

## 2021-11-12 DIAGNOSIS — M5416 Radiculopathy, lumbar region: Secondary | ICD-10-CM | POA: Diagnosis not present

## 2021-11-12 MED ORDER — CONTOUR NEXT TEST VI STRP: ORAL_STRIP | 3 refills | Status: AC

## 2021-11-12 MED ORDER — CONTOUR NEXT GEN MONITOR W/DEVICE KIT: PACK | 0 refills | Status: AC

## 2021-11-12 MED ORDER — OZEMPIC (0.25 OR 0.5 MG/DOSE) 2 MG/3ML ~~LOC~~ SOPN: 0.5000 mg | PEN_INJECTOR | SUBCUTANEOUS | 6 refills | Status: DC

## 2021-11-12 NOTE — Assessment & Plan Note (Addendum)
Initially on metformin, this was transitioned to ozempic last week, tolerating well. Has been on 0.25mg  weekly dose for the past month, discussed titrate up to 0.5mg  weekly at next fill.  ?Discussed diabetes management as well as importance of regular foot care and yearly diabetic retinopathy screen.  ?Will mail diabetes care handout.  ?Discussed sugar monitoring with glucometer, as well as goal ranges. Hypoglycemia plan provided. ?We can continue filling ozempic until she establishes with new provider in [place].  ?Check fructosamine today.  ?

## 2021-11-12 NOTE — Assessment & Plan Note (Signed)
Just completed what sounds like lumbar ESI this week through ortho. She continues sparing percocets PRN breakthrough pain. Discussed steroid effect on sugars.  ?

## 2021-11-12 NOTE — Progress Notes (Signed)
? ? Patient ID: Laura Calderon, female    DOB: 08/28/1980, 41 y.o.   MRN: [id number] ? ?This visit was conducted in person. ? ?BP 136/86   Pulse 74   Temp 98.2 ?F (36.8 ?C) (Temporal)   Ht 5' 9.5" (1.765 m)   Wt 269 lb 2 oz (122.1 kg)   LMP 11/02/2021   SpO2 97%   BMI 39.17 kg/m?   ? ?CC: DM f/u visit  ?Subjective:  ? ?HPI: ?Laura Calderon is a 41 y.o. female presenting on 11/12/2021 for Diabetes (Here for 6 wk f/u.) ? 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  ? ?Recent lumbar ESI this week - hopeful for improvement in L lumbar radiculopathy.  ? ?DM - does not regularly check sugars. Compliant with antihyperglycemic regimen which includes: diet control. Metformin started 05/2021 then stopped.  Last visit we sent in ozempic to price out - she's currently taking ozempic 0.25mg  weekly with plan to increase to 0.5mg  dose. Denies low sugars or hypoglycemic symptoms. Denies paresthesias, blurry vision. Last diabetic eye exam DUE. Glucometer brand: doesn't have one - but requests Contour next. Last foot exam: DUE - today. DSME: discussed, declines for now given upcoming move. ?Lab Results  ?Component Value Date  ? HGBA1C 6.8 (H) 09/08/2021  ? ?Diabetic Foot Exam - Simple   ?Simple Foot Form ?Diabetic Foot exam was performed with the following findings: Yes 11/12/2021 12:18 PM  ?Visual Inspection ?No deformities, no ulcerations, no other skin breakdown bilaterally: Yes ?Sensation Testing ?Intact to touch and monofilament testing bilaterally: Yes ?Pulse Check ?Posterior Tibialis and Dorsalis pulse intact bilaterally: Yes ?Comments ?  ? ?Lab Results  ?Component Value Date  ? MICROALBUR 1.2 09/08/2021  ? ?   ? ?Relevant past medical, surgical, family and social history reviewed and updated as indicated. Interim medical history since our last visit reviewed. ?Allergies and medications reviewed and updated. ?Outpatient Medications Prior to Visit  ?Medication Sig Dispense Refill  ? amLODipine  (NORVASC) 5 MG tablet Take 1 tablet (5 mg total) by mouth daily. 90 tablet 3  ? gabapentin (NEURONTIN) 100 MG capsule Take 1-3 capsules (100-300 mg total) by mouth 2 (two) times daily as needed (neck pain). 90 capsule 1  ? hydrOXYzine (ATARAX) 25 MG tablet Take 1 tablet (25 mg total) by mouth 2 (two) times daily as needed for anxiety. 60 tablet 3  ? oxyCODONE-acetaminophen (PERCOCET/ROXICET) 5-325 MG tablet Take 1 tablet by mouth every 8 (eight) hours as needed for up to 5 days for severe pain. 15 tablet 0  ? Cholecalciferol (VITAMIN D3) 50 MCG (2000 UT) capsule Take 1 capsule (2,000 Units total) by mouth daily.    ? cyclobenzaprine (FLEXERIL) 10 MG tablet Take 0.5-1 tablets (5-10 mg total) by mouth 2 (two) times daily as needed for muscle spasms. 30 tablet 0  ? metFORMIN (GLUCOPHAGE) 500 MG tablet Take 1 tablet (500 mg total) by mouth daily with breakfast. 90 tablet 3  ? naproxen (NAPROSYN) 500 MG tablet TAKE 1 TABLET BY MOUTH TWICE A DAY FOR 1 WEEK, THEN AS NEEDED FOR PAIN. TAKE WITH FOOD 50 tablet 0  ? OZEMPIC, 0.25 OR 0.5 MG/DOSE, 2 MG/3ML SOPN Inject into the skin.    ? predniSONE (DELTASONE) 20 MG tablet Take two tablets daily for 3 days followed by one tablet daily for 3 days 9 tablet 0  ? ?No facility-administered medications prior to visit.  ?  ? ?Per HPI unless specifically indicated in  ROS section below ?Review of Systems ? ?Objective:  ?BP 136/86   Pulse 74   Temp 98.2 ?F (36.8 ?C) (Temporal)   Ht 5' 9.5" (1.765 m)   Wt 269 lb 2 oz (122.1 kg)   LMP 11/02/2021   SpO2 97%   BMI 39.17 kg/m?   ?Wt Readings from Last 3 Encounters:  ?11/12/21 269 lb 2 oz (122.1 kg)  ?09/15/21 274 lb 6 oz (124.5 kg)  ?06/16/21 270 lb (122.5 kg)  ?  ?  ?Physical Exam ?Vitals and nursing note reviewed.  ?Constitutional:   ?   Appearance: Normal appearance. She is not ill-appearing.  ?Eyes:  ?   Extraocular Movements: Extraocular movements intact.  ?   Conjunctiva/sclera: Conjunctivae normal.  ?   Pupils: Pupils are equal,  round, and reactive to light.  ?Cardiovascular:  ?   Rate and Rhythm: Normal rate and regular rhythm.  ?   Pulses: Normal pulses.  ?   Heart sounds: Normal heart sounds. No murmur heard. ?Pulmonary:  ?   Effort: Pulmonary effort is normal. No respiratory distress.  ?   Breath sounds: Normal breath sounds. No wheezing, rhonchi or rales.  ?Musculoskeletal:  ?   Right lower leg: No edema.  ?   Left lower leg: No edema.  ?Skin: ?   General: Skin is warm and dry.  ?   Findings: No rash.  ?Neurological:  ?   Mental Status: She is alert.  ?Psychiatric:     ?   Mood and Affect: Mood normal.     ?   Behavior: Behavior normal.  ? ?   ?Results for orders placed or performed in visit on 09/08/21  ?Hepatitis C antibody  ?Result Value Ref Range  ? Hepatitis C Ab NON-REACTIVE NON-REACTIVE  ? SIGNAL TO CUT-OFF <0.02 <1.00  ?Microalbumin / creatinine urine ratio  ?Result Value Ref Range  ? Microalb, Ur 1.2 0.0 - 1.9 mg/dL  ? Creatinine,U 180.7 mg/dL  ? Microalb Creat Ratio 0.7 0.0 - 30.0 mg/g  ?VITAMIN D 25 Hydroxy (Vit-D Deficiency, Fractures)  ?Result Value Ref Range  ? VITD 18.99 (L) 30.00 - 100.00 ng/mL  ?Hemoglobin A1c  ?Result Value Ref Range  ? Hgb A1c MFr Bld 6.8 (H) 4.6 - 6.5 %  ?Lipid panel  ?Result Value Ref Range  ? Cholesterol 155 0 - 200 mg/dL  ? Triglycerides 164.0 (H) 0.0 - 149.0 mg/dL  ? HDL 45.50 >39.00 mg/dL  ? VLDL 32.8 0.0 - 40.0 mg/dL  ? LDL Cholesterol 77 0 - 99 mg/dL  ? Total CHOL/HDL Ratio 3   ? NonHDL 109.34   ?CBC with Differential/Platelet  ?Result Value Ref Range  ? WBC 9.4 4.0 - 10.5 K/uL  ? RBC 5.03 3.87 - 5.11 Mil/uL  ? Hemoglobin 13.2 12.0 - 15.0 g/dL  ? HCT 40.4 36.0 - 46.0 %  ? MCV 80.3 78.0 - 100.0 fl  ? MCHC 32.7 30.0 - 36.0 g/dL  ? RDW 13.6 11.5 - 15.5 %  ? Platelets 323.0 150.0 - 400.0 K/uL  ? Neutrophils Relative % 54.3 43.0 - 77.0 %  ? Lymphocytes Relative 34.5 12.0 - 46.0 %  ? Monocytes Relative 8.1 3.0 - 12.0 %  ? Eosinophils Relative 2.2 0.0 - 5.0 %  ? Basophils Relative 0.9 0.0 - 3.0 %  ?  Neutro Abs 5.1 1.4 - 7.7 K/uL  ? Lymphs Abs 3.2 0.7 - 4.0 K/uL  ? Monocytes Absolute 0.8 0.1 - 1.0 K/uL  ? Eosinophils Absolute 0.2  0.0 - 0.7 K/uL  ? Basophils Absolute 0.1 0.0 - 0.1 K/uL  ?Comprehensive metabolic panel  ?Result Value Ref Range  ? Sodium 138 135 - 145 mEq/L  ? Potassium 4.4 3.5 - 5.1 mEq/L  ? Chloride 104 96 - 112 mEq/L  ? CO2 28 19 - 32 mEq/L  ? Glucose, Bld 97 70 - 99 mg/dL  ? BUN 19 6 - 23 mg/dL  ? Creatinine, Ser 1.00 0.40 - 1.20 mg/dL  ? Total Bilirubin 0.3 0.2 - 1.2 mg/dL  ? Alkaline Phosphatase 60 39 - 117 U/L  ? AST 16 0 - 37 U/L  ? ALT 19 0 - 35 U/L  ? Total Protein 7.0 6.0 - 8.3 g/dL  ? Albumin 4.0 3.5 - 5.2 g/dL  ? GFR 70.19 >60.00 mL/min  ? Calcium 9.2 8.4 - 10.5 mg/dL  ? ? ?Assessment & Plan:  ? ?Problem List Items Addressed This Visit   ? ? Type 2 diabetes mellitus with other specified complication (HCC) - Primary  ?  Initially on metformin, this was transitioned to ozempic last week, tolerating well. Has been on 0.25mg  weekly dose for the past month, discussed titrate up to 0.5mg  weekly at next fill.  ?Discussed diabetes management as well as importance of regular foot care and yearly diabetic retinopathy screen.  ?Will mail diabetes care handout.  ?Discussed sugar monitoring with glucometer, as well as goal ranges. Hypoglycemia plan provided. ?We can continue filling ozempic until she establishes with new provider in [place].  ?Check fructosamine today.  ? ?  ?  ? Relevant Medications  ? Semaglutide,0.25 or 0.5MG /DOS, (OZEMPIC, 0.25 OR 0.5 MG/DOSE,) 2 MG/3ML SOPN  ? Other Relevant Orders  ? Fructosamine  ? Acute left lumbar radiculopathy  ?  Just completed what sounds like lumbar ESI this week through ortho. She continues sparing percocets PRN breakthrough pain. Discussed steroid effect on sugars.  ? ?  ?  ?  ? ?Meds ordered this encounter  ?Medications  ? Semaglutide,0.25 or 0.5MG /DOS, (OZEMPIC, 0.25 OR 0.5 MG/DOSE,) 2 MG/3ML SOPN  ?  Sig: Inject 0.5 mg into the skin once a week.   ?  Dispense:  3 mL  ?  Refill:  6  ? glucose blood (CONTOUR NEXT TEST) test strip  ?  Sig: Use as instructed to check sugars daily and as needed E11.69  ?  Dispense:  100 each  ?  Refill:  3  ? Blood Glucose Monit

## 2021-11-12 NOTE — Patient Instructions (Addendum)
Labs today  ?With diabetes we recommend close monitoring of feet, and yearly diabetic eye exam.  ?Continue ozempic 0.5mg  weekly ?I've sent in prescription for Contour next glucometer. Check sugars either before a meal or 2 hours after meal. Goal fasting 80-120, goal 2 hours after meal <180. Too low is <70 - if this happens, eat 15gm carbs and recheck in 15 min as per below.  ? ?The 15-15 rule for low sugars: ?If sugar reading below 70, have 15 grams of carbohydrate to raise your blood sugar and check it after 15 minutes. If it?s still below 70 mg/dL, have another serving. ?15 grams of carbs may be: ?-Glucose tablets (see instructions) ?-Gel tube (see instructions) ?-4 ounces (1/2 cup) of juice or regular soda (not diet) ?-1 tablespoon of sugar, honey, or corn syrup ?-Hard candies, jellybeans or gumdrops--see food label for how many to consume  ?Repeat these steps until your blood sugar is at least 70 mg/dL. Once your blood sugar is back to normal, eat a meal or snack to make sure it doesn?t lower again.   ?

## 2021-11-18 ENCOUNTER — Other Ambulatory Visit: Payer: Self-pay | Admitting: Family Medicine

## 2021-11-18 LAB — FRUCTOSAMINE: Fructosamine: 246 umol/L (ref 205–285)

## 2021-11-30 DIAGNOSIS — M5416 Radiculopathy, lumbar region: Secondary | ICD-10-CM | POA: Diagnosis not present

## 2021-12-03 DIAGNOSIS — F331 Major depressive disorder, recurrent, moderate: Secondary | ICD-10-CM | POA: Diagnosis not present

## 2021-12-03 DIAGNOSIS — F410 Panic disorder [episodic paroxysmal anxiety] without agoraphobia: Secondary | ICD-10-CM | POA: Diagnosis not present

## 2021-12-06 DIAGNOSIS — F331 Major depressive disorder, recurrent, moderate: Secondary | ICD-10-CM | POA: Diagnosis not present

## 2021-12-06 DIAGNOSIS — F410 Panic disorder [episodic paroxysmal anxiety] without agoraphobia: Secondary | ICD-10-CM | POA: Diagnosis not present

## 2021-12-10 MED ORDER — OXYCODONE-ACETAMINOPHEN 5-325 MG PO TABS: 1.0000 | ORAL_TABLET | Freq: Three times a day (TID) | ORAL | 0 refills | Status: AC | PRN

## 2021-12-10 NOTE — Addendum Note (Signed)
Addended by: Ria Bush on: 12/10/2021 07:36 AM   Modules accepted: Orders

## 2021-12-13 DIAGNOSIS — F331 Major depressive disorder, recurrent, moderate: Secondary | ICD-10-CM | POA: Diagnosis not present

## 2021-12-13 DIAGNOSIS — F410 Panic disorder [episodic paroxysmal anxiety] without agoraphobia: Secondary | ICD-10-CM | POA: Diagnosis not present

## 2022-02-21 ENCOUNTER — Encounter: Payer: Self-pay | Admitting: Family Medicine

## 2022-02-23 MED ORDER — TIRZEPATIDE 2.5 MG/0.5ML ~~LOC~~ SOAJ: 2.5000 mg | SUBCUTANEOUS | 0 refills | Status: AC

## 2022-02-23 NOTE — Addendum Note (Signed)
Addended by: Eustaquio Boyden on: 02/23/2022 08:08 AM   Modules accepted: Orders

## 2022-02-24 ENCOUNTER — Telehealth: Payer: Self-pay

## 2022-02-24 NOTE — Telephone Encounter (Signed)
Prior auth started for [facility] 2.5MG /0.5ML pen-injectors. Etheleen Mayhew KeyLovell Sheehan - Rx #: [id number] Per CMM:  Patient authentication failed. The patient's zip code and/or phone number provided do not match our records. Please update the request and resubmit via ePA.  I called patient to verify home address.  She states they have moved to another state.  I asked if I still need to fill out this prior auth for [facility].  [facility] and that the cost of the medication is $25, and that no prior Berkley Harvey is needed.

## 2022-02-24 NOTE — Telephone Encounter (Signed)
Noted  

## 2022-03-22 ENCOUNTER — Other Ambulatory Visit: Payer: Self-pay | Admitting: Family Medicine

## 2022-03-22 NOTE — Telephone Encounter (Signed)
Refill request Mounjaro Last office visit 11/12/21 Last refill 02/23/22  2 ml

## 2022-03-24 NOTE — Telephone Encounter (Signed)
Declined. Pt established with new provider in [facility] 03/11/2022

## 2023-05-04 IMAGING — DX DG LUMBAR SPINE COMPLETE 4+V
6 series · 6 of 6 positions shown · non-contrast
Comparison: None.

CLINICAL DATA: Severe left back and leg pain with radiculopathy

EXAM:
LUMBAR SPINE - COMPLETE 4+ VIEW

[lumbar spine ap (1 of 2)]
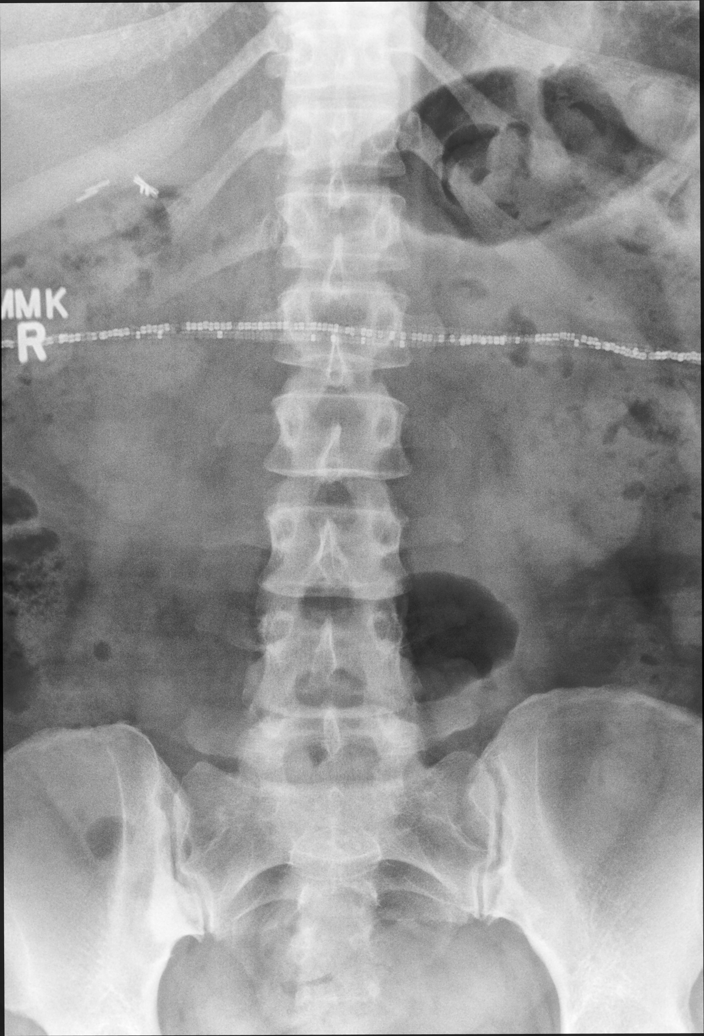

[lumbar spine ap (2 of 2)]
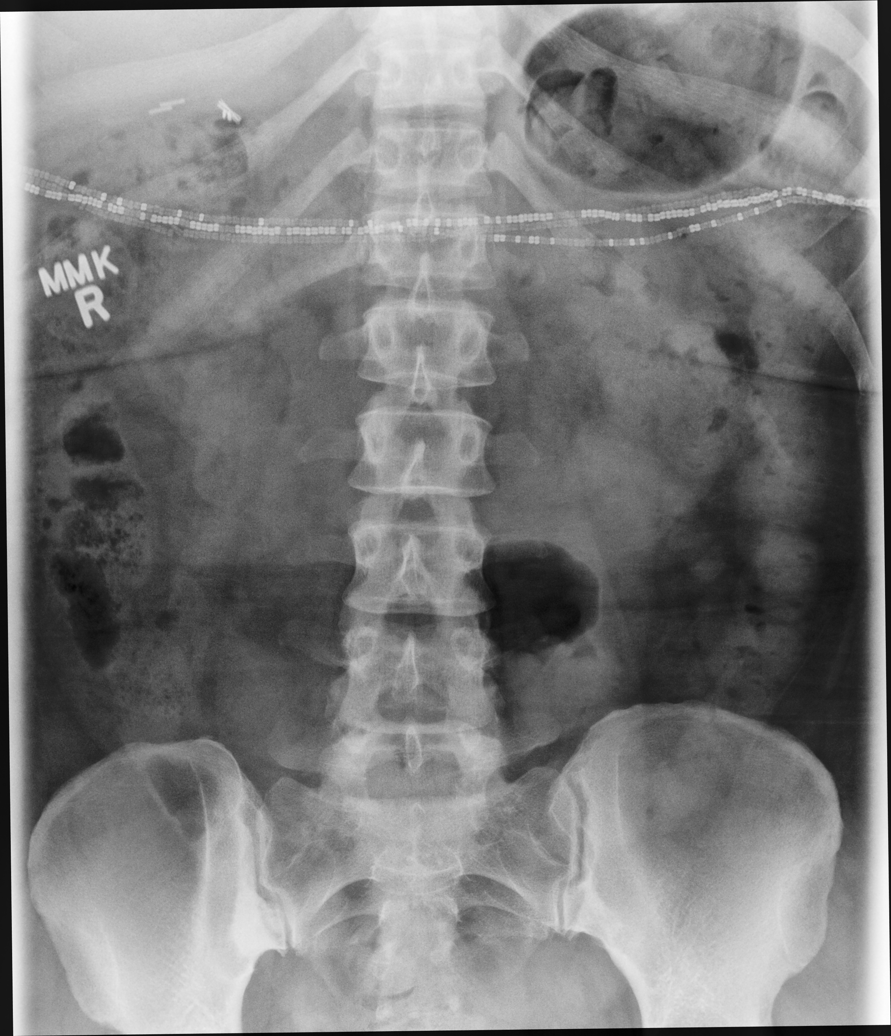

[lumbar spine mlo (1 of 2)]
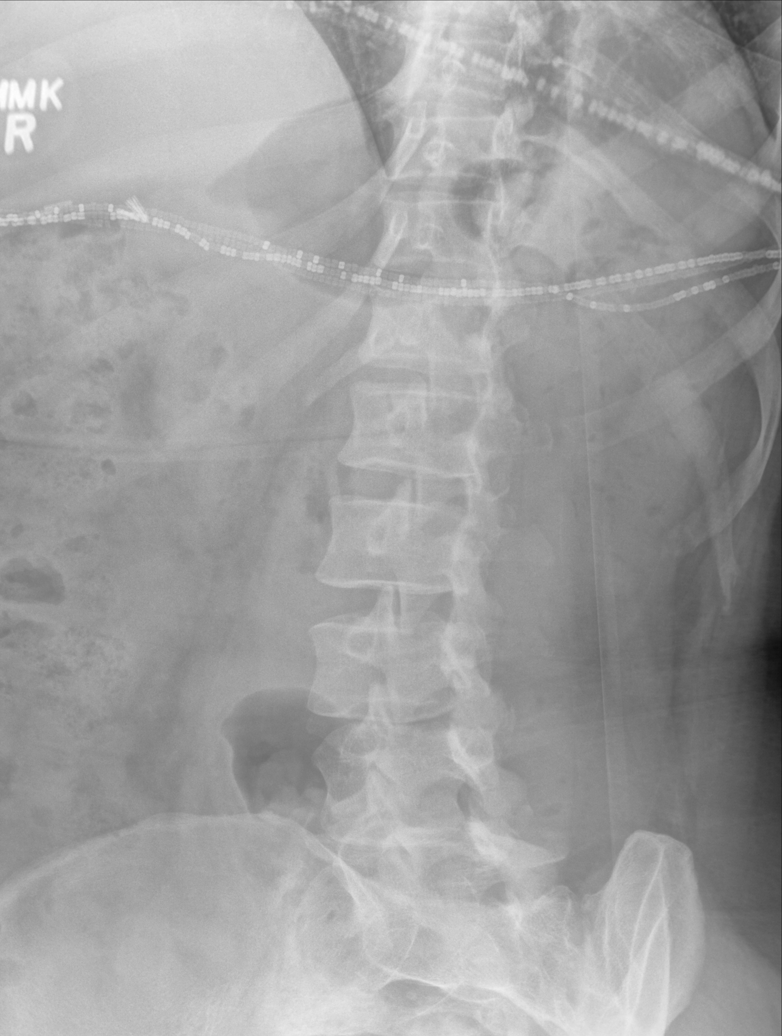

[lumbar spine mlo (2 of 2)]
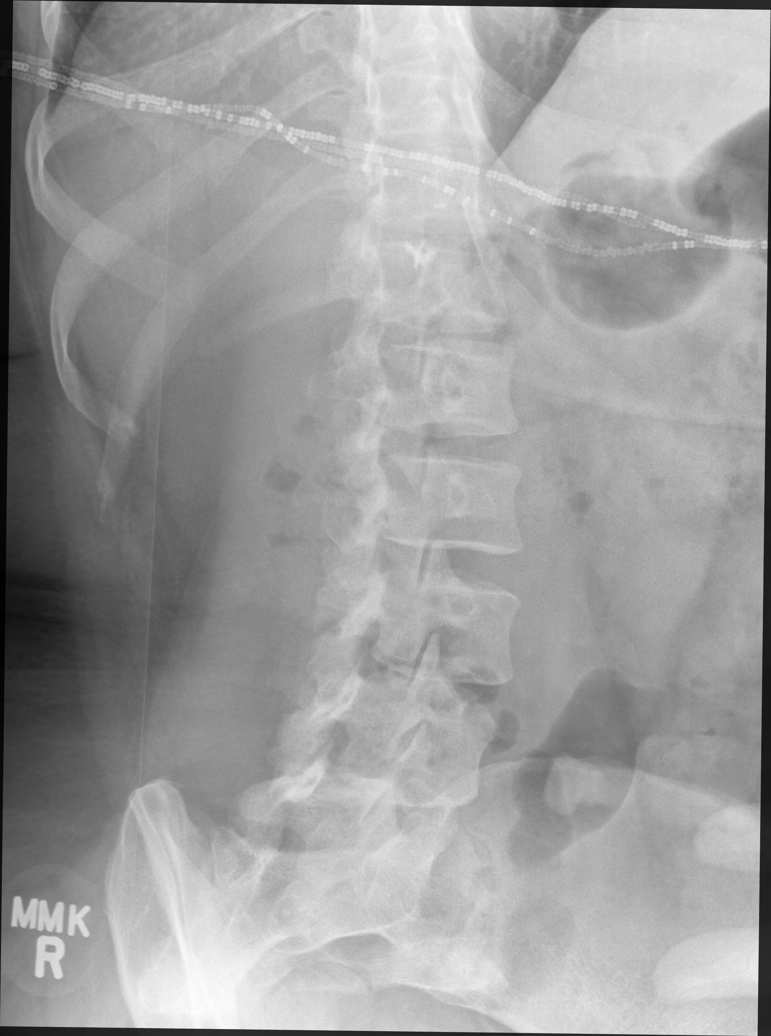

[lumbar spine lat (1 of 2)]
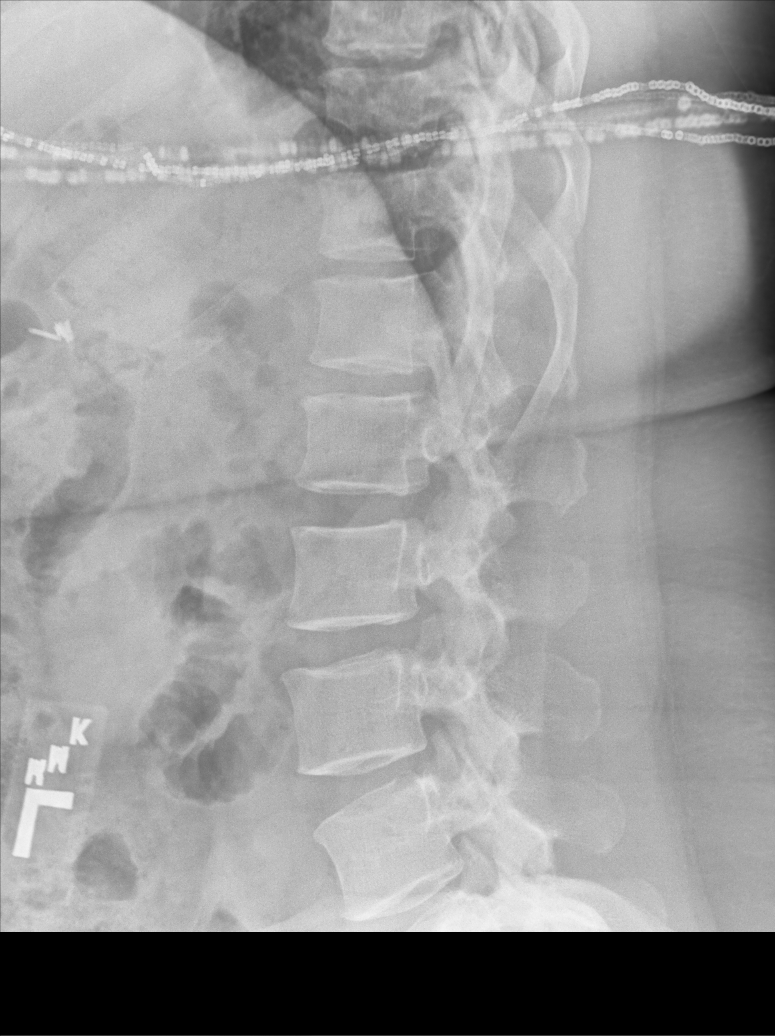

[lumbar spine lat (2 of 2)]
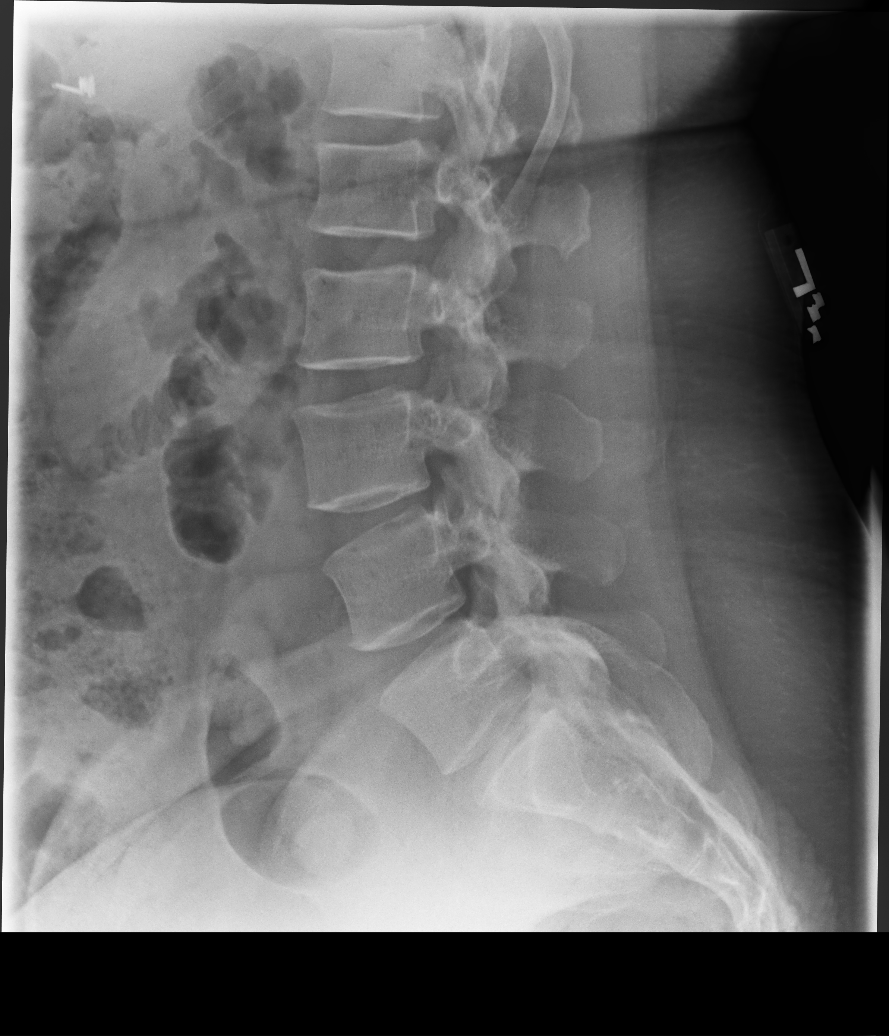

[6 of 6 positions shown; findings below may reference images not displayed]

FINDINGS: Artifact overlies the upper abdomen. Remote cholecystectomy noted.
Nonobstructive bowel gas pattern.

Normal lumbar spine alignment. Preserved vertebral body heights and
disc spaces. No significant degenerative change or spondylosis.
Facets are aligned. No pars defects. Normal appearing pedicles.
Lateral SI joint sclerosis bilaterally, worse on the right can be
seen with sacroiliitis. Included upper pelvis intact.
IMPRESSION: No acute lumbar spine abnormality or significant degenerative
changes.

Mild bilateral SI joint arthropathy/sacroiliitis, worse on the
right.
